# Patient Record
Sex: Female | Born: 1980 | Race: White | Hispanic: No | Marital: Single | Smoking: Former smoker
Health system: Southern US, Community
[De-identification: ages and names within clinical notes are randomized; demographics above are authoritative.]

## PROBLEM LIST (undated history)

## (undated) DIAGNOSIS — K760 Fatty (change of) liver, not elsewhere classified: Secondary | ICD-10-CM

## (undated) DIAGNOSIS — R7303 Prediabetes: Secondary | ICD-10-CM

## (undated) DIAGNOSIS — R87629 Unspecified abnormal cytological findings in specimens from vagina: Secondary | ICD-10-CM

## (undated) DIAGNOSIS — T85898A Other specified complication of other internal prosthetic devices, implants and grafts, initial encounter: Secondary | ICD-10-CM

## (undated) DIAGNOSIS — D649 Anemia, unspecified: Secondary | ICD-10-CM

## (undated) DIAGNOSIS — B9689 Other specified bacterial agents as the cause of diseases classified elsewhere: Secondary | ICD-10-CM

## (undated) DIAGNOSIS — B999 Unspecified infectious disease: Secondary | ICD-10-CM

## (undated) DIAGNOSIS — K289 Gastrojejunal ulcer, unspecified as acute or chronic, without hemorrhage or perforation: Secondary | ICD-10-CM

## (undated) DIAGNOSIS — N760 Acute vaginitis: Secondary | ICD-10-CM

## (undated) HISTORY — PX: DILATION AND CURETTAGE OF UTERUS: SHX78

## (undated) HISTORY — DX: Prediabetes: R73.03

## (undated) HISTORY — DX: Unspecified infectious disease: B99.9

## (undated) HISTORY — DX: Unspecified abnormal cytological findings in specimens from vagina: R87.629

## (undated) HISTORY — PX: WRIST SURGERY: SHX841

## (undated) HISTORY — PX: GASTRIC BYPASS: SHX52

## (undated) HISTORY — PX: BREAST SURGERY: SHX581

## (undated) HISTORY — PX: TONSILLECTOMY: SUR1361

---

## 2012-10-04 HISTORY — DX: Morbid (severe) obesity due to excess calories: E66.01

## 2013-10-04 HISTORY — PX: BREAST SURGERY: SHX581

## 2014-09-23 LAB — CYTOLOGY - PAP: CYTOLOGY - PAP: NEGATIVE

## 2015-01-20 LAB — OB RESULTS CONSOLE HIV ANTIBODY (ROUTINE TESTING): HIV: NONREACTIVE

## 2015-01-20 LAB — OB RESULTS CONSOLE PLATELET COUNT: Platelets: 259 10*3/uL

## 2015-01-20 LAB — TSH: TSH: 0.91

## 2015-01-20 LAB — OB RESULTS CONSOLE ANTIBODY SCREEN: Antibody Screen: NEGATIVE

## 2015-01-20 LAB — OB RESULTS CONSOLE VARICELLA ZOSTER ANTIBODY, IGG: VARICELLA IGG: IMMUNE

## 2015-01-20 LAB — OB RESULTS CONSOLE ABO/RH: RH TYPE: POSITIVE

## 2015-01-20 LAB — OB RESULTS CONSOLE GC/CHLAMYDIA
Chlamydia: NEGATIVE
Gonorrhea: NEGATIVE

## 2015-01-20 LAB — OB RESULTS CONSOLE HEPATITIS B SURFACE ANTIGEN: HEP B S AG: NEGATIVE

## 2015-01-20 LAB — OB RESULTS CONSOLE HGB/HCT, BLOOD
HCT: 37 %
Hemoglobin: 12.3 g/dL

## 2015-01-20 LAB — OB RESULTS CONSOLE RUBELLA ANTIBODY, IGM: RUBELLA: IMMUNE

## 2015-01-20 LAB — GLUCOSE TOLERANCE, 1 HOUR (50G) W/O FASTING: Glucose 1 Hr Prenatal, POC: 45 mg/dL

## 2015-03-11 LAB — CULTURE, OB URINE: Urine Culture, OB: NEGATIVE

## 2015-06-16 ENCOUNTER — Encounter: Payer: Self-pay | Admitting: *Deleted

## 2015-06-16 DIAGNOSIS — R87619 Unspecified abnormal cytological findings in specimens from cervix uteri: Secondary | ICD-10-CM | POA: Insufficient documentation

## 2015-06-16 DIAGNOSIS — A749 Chlamydial infection, unspecified: Secondary | ICD-10-CM | POA: Insufficient documentation

## 2015-06-16 DIAGNOSIS — E079 Disorder of thyroid, unspecified: Secondary | ICD-10-CM | POA: Insufficient documentation

## 2015-06-16 DIAGNOSIS — Z9884 Bariatric surgery status: Secondary | ICD-10-CM | POA: Insufficient documentation

## 2015-06-24 ENCOUNTER — Ambulatory Visit (INDEPENDENT_AMBULATORY_CARE_PROVIDER_SITE_OTHER): Payer: Self-pay | Admitting: Family Medicine

## 2015-06-24 ENCOUNTER — Encounter: Payer: Self-pay | Admitting: Family Medicine

## 2015-06-24 VITALS — BP 128/77 | HR 88 | Temp 98.6°F | Ht 66.0 in | Wt 243.8 lb

## 2015-06-24 DIAGNOSIS — R87619 Unspecified abnormal cytological findings in specimens from cervix uteri: Secondary | ICD-10-CM

## 2015-06-24 DIAGNOSIS — O98813 Other maternal infectious and parasitic diseases complicating pregnancy, third trimester: Secondary | ICD-10-CM

## 2015-06-24 DIAGNOSIS — O0993 Supervision of high risk pregnancy, unspecified, third trimester: Secondary | ICD-10-CM

## 2015-06-24 DIAGNOSIS — E079 Disorder of thyroid, unspecified: Secondary | ICD-10-CM

## 2015-06-24 DIAGNOSIS — A749 Chlamydial infection, unspecified: Secondary | ICD-10-CM

## 2015-06-24 DIAGNOSIS — Z98891 History of uterine scar from previous surgery: Secondary | ICD-10-CM | POA: Insufficient documentation

## 2015-06-24 DIAGNOSIS — Z23 Encounter for immunization: Secondary | ICD-10-CM

## 2015-06-24 DIAGNOSIS — O0990 Supervision of high risk pregnancy, unspecified, unspecified trimester: Secondary | ICD-10-CM | POA: Insufficient documentation

## 2015-06-24 DIAGNOSIS — O26849 Uterine size-date discrepancy, unspecified trimester: Secondary | ICD-10-CM | POA: Insufficient documentation

## 2015-06-24 DIAGNOSIS — O3421 Maternal care for scar from previous cesarean delivery: Secondary | ICD-10-CM

## 2015-06-24 DIAGNOSIS — Z9884 Bariatric surgery status: Secondary | ICD-10-CM

## 2015-06-24 DIAGNOSIS — O26843 Uterine size-date discrepancy, third trimester: Secondary | ICD-10-CM

## 2015-06-24 LAB — POCT URINALYSIS DIP (DEVICE)
BILIRUBIN URINE: NEGATIVE
Glucose, UA: NEGATIVE mg/dL
HGB URINE DIPSTICK: NEGATIVE
KETONES UR: NEGATIVE mg/dL
LEUKOCYTES UA: NEGATIVE
Nitrite: NEGATIVE
Protein, ur: NEGATIVE mg/dL
SPECIFIC GRAVITY, URINE: 1.025 (ref 1.005–1.030)
Urobilinogen, UA: 1 mg/dL (ref 0.0–1.0)
pH: 6.5 (ref 5.0–8.0)

## 2015-06-24 MED ORDER — TETANUS-DIPHTH-ACELL PERTUSSIS 5-2.5-18.5 LF-MCG/0.5 IM SUSP
0.5000 mL | Freq: Once | INTRAMUSCULAR | Status: AC
  Administered 2015-06-24: 0.5 mL via INTRAMUSCULAR

## 2015-06-24 NOTE — Assessment & Plan Note (Signed)
02/2014 ASCUS + HPV--> pp colpo

## 2015-06-24 NOTE — Patient Instructions (Signed)
Third Trimester of Pregnancy The third trimester is from week 29 through week 42, months 7 through 9. The third trimester is a time when the fetus is growing rapidly. At the end of the ninth month, the fetus is about 20 inches in length and weighs 6-10 pounds.  BODY CHANGES Your body goes through many changes during pregnancy. The changes vary from woman to woman.   Your weight will continue to increase. You can expect to gain 25-35 pounds (11-16 kg) by the end of the pregnancy.  You may begin to get stretch marks on your hips, abdomen, and breasts.  You may urinate more often because the fetus is moving lower into your pelvis and pressing on your bladder.  You may develop or continue to have heartburn as a result of your pregnancy.  You may develop constipation because certain hormones are causing the muscles that push waste through your intestines to slow down.  You may develop hemorrhoids or swollen, bulging veins (varicose veins).  You may have pelvic pain because of the weight gain and pregnancy hormones relaxing your joints between the bones in your pelvis. Backaches may result from overexertion of the muscles supporting your posture.  You may have changes in your hair. These can include thickening of your hair, rapid growth, and changes in texture. Some women also have hair loss during or after pregnancy, or hair that feels dry or thin. Your hair will most likely return to normal after your baby is born.  Your breasts will continue to grow and be tender. A yellow discharge may leak from your breasts called colostrum.  Your belly button may stick out.  You may feel short of breath because of your expanding uterus.  You may notice the fetus "dropping," or moving lower in your abdomen.  You may have a bloody mucus discharge. This usually occurs a few days to a week before labor begins.  Your cervix becomes thin and soft (effaced) near your due date. WHAT TO EXPECT AT YOUR PRENATAL  EXAMS  You will have prenatal exams every 2 weeks until week 36. Then, you will have weekly prenatal exams. During a routine prenatal visit:  You will be weighed to make sure you and the fetus are growing normally.  Your blood pressure is taken.  Your abdomen will be measured to track your baby's growth.  The fetal heartbeat will be listened to.  Any test results from the previous visit will be discussed.  You may have a cervical check near your due date to see if you have effaced. At around 36 weeks, your caregiver will check your cervix. At the same time, your caregiver will also perform a test on the secretions of the vaginal tissue. This test is to determine if a type of bacteria, Group B streptococcus, is present. Your caregiver will explain this further. Your caregiver may ask you:  What your birth plan is.  How you are feeling.  If you are feeling the baby move.  If you have had any abnormal symptoms, such as leaking fluid, bleeding, severe headaches, or abdominal cramping.  If you have any questions. Other tests or screenings that may be performed during your third trimester include:  Blood tests that check for low iron levels (anemia).  Fetal testing to check the health, activity level, and growth of the fetus. Testing is done if you have certain medical conditions or if there are problems during the pregnancy. FALSE LABOR You may feel small, irregular contractions that   eventually go away. These are called Braxton Hicks contractions, or false labor. Contractions may last for hours, days, or even weeks before true labor sets in. If contractions come at regular intervals, intensify, or become painful, it is best to be seen by your caregiver.  SIGNS OF LABOR   Menstrual-like cramps.  Contractions that are 5 minutes apart or less.  Contractions that start on the top of the uterus and spread down to the lower abdomen and back.  A sense of increased pelvic pressure or back  pain.  A watery or bloody mucus discharge that comes from the vagina. If you have any of these signs before the 37th week of pregnancy, call your caregiver right away. You need to go to the hospital to get checked immediately. HOME CARE INSTRUCTIONS   Avoid all smoking, herbs, alcohol, and unprescribed drugs. These chemicals affect the formation and growth of the baby.  Follow your caregiver's instructions regarding medicine use. There are medicines that are either safe or unsafe to take during pregnancy.  Exercise only as directed by your caregiver. Experiencing uterine cramps is a good sign to stop exercising.  Continue to eat regular, healthy meals.  Wear a good support bra for breast tenderness.  Do not use hot tubs, steam rooms, or saunas.  Wear your seat belt at all times when driving.  Avoid raw meat, uncooked cheese, cat litter boxes, and soil used by cats. These carry germs that can cause birth defects in the baby.  Take your prenatal vitamins.  Try taking a stool softener (if your caregiver approves) if you develop constipation. Eat more high-fiber foods, such as fresh vegetables or fruit and whole grains. Drink plenty of fluids to keep your urine clear or pale yellow.  Take warm sitz baths to soothe any pain or discomfort caused by hemorrhoids. Use hemorrhoid cream if your caregiver approves.  If you develop varicose veins, wear support hose. Elevate your feet for 15 minutes, 3-4 times a day. Limit salt in your diet.  Avoid heavy lifting, wear low heal shoes, and practice good posture.  Rest a lot with your legs elevated if you have leg cramps or low back pain.  Visit your dentist if you have not gone during your pregnancy. Use a soft toothbrush to brush your teeth and be gentle when you floss.  A sexual relationship may be continued unless your caregiver directs you otherwise.  Do not travel far distances unless it is absolutely necessary and only with the approval  of your caregiver.  Take prenatal classes to understand, practice, and ask questions about the labor and delivery.  Make a trial run to the hospital.  Pack your hospital bag.  Prepare the baby's nursery.  Continue to go to all your prenatal visits as directed by your caregiver. SEEK MEDICAL CARE IF:  You are unsure if you are in labor or if your water has broken.  You have dizziness.  You have mild pelvic cramps, pelvic pressure, or nagging pain in your abdominal area.  You have persistent nausea, vomiting, or diarrhea.  You have a bad smelling vaginal discharge.  You have pain with urination. SEEK IMMEDIATE MEDICAL CARE IF:   You have a fever.  You are leaking fluid from your vagina.  You have spotting or bleeding from your vagina.  You have severe abdominal cramping or pain.  You have rapid weight loss or gain.  You have shortness of breath with chest pain.  You notice sudden or extreme swelling   of your face, hands, ankles, feet, or legs.  You have not felt your baby move in over an hour.  You have severe headaches that do not go away with medicine.  You have vision changes. Document Released: 09/14/2001 Document Revised: 09/25/2013 Document Reviewed: 11/21/2012 [facility] Patient Information 2015 [place], [place]. This information is not intended to replace advice given to you by your health care provider. Make sure you discuss any questions you have with your health care provider.  Nutrition for the New Mother  A new mother needs good health and nutrition so she can have energy to take care of a new baby. Whether a mother breastfeeds or formula feeds the baby, it is important to have a well-balanced diet. Foods from all the food groups should be chosen to meet the new mother's energy needs and to give her the nutrients needed for repair and healing.  A HEALTHY EATING PLAN The My Pyramid plan for Moms outlines what you should eat to help you and your baby stay  healthy. The energy and amount of food you need depends on whether or not you are breastfeeding. If you are breastfeeding you will need more nutrients. If you choose not to breastfeed, your nutrition goal should be to return to a healthy weight. Limiting calories may be needed if you are not breastfeeding.  HOME CARE INSTRUCTIONS   For a personal plan based on your unique needs, see your Registered Dietitian or visit [email or web address]  Eat a variety of foods. The plan below will help guide you. The following chart has a suggested daily meal plan from the My Pyramid for Moms.  Eat a variety of fruits and vegetables.  Eat more dark green and orange vegetables and cooked dried beans.  Make half your grains whole grains. Choose whole instead of refined grains.  Choose low-fat or lean meats and poultry.  Choose low-fat or fat-free dairy products like milk, cheese, or yogurt. Fruits  Breastfeeding: 2 cups  Non-Breastfeeding: 2 cups  What Counts as a serving?  1 cup of fruit or juice.   cup dried fruit. Vegetables  Breastfeeding: 3 cups  Non-Breastfeeding: 2  cups  What Counts as a serving?  1 cup raw or cooked vegetables.  Juice or 2 cups raw leafy vegetables. Grains  Breastfeeding: 8 oz  Non-Breastfeeding: 6 oz  What Counts as a serving?  1 slice bread.  1 oz ready-to-eat cereal.   cup cooked pasta, rice, or cereal. Meat and Beans  Breastfeeding: 6  oz  Non-Breastfeeding: 5  oz  What Counts as a serving?  1 oz lean meat, poultry, or fish   cup cooked dry beans   oz nuts or 1 egg  1 tbs peanut butter Milk  Breastfeeding: 3 cups  Non-Breastfeeding: 3 cups  What Counts as a serving?  1 cup milk.  8 oz yogurt.  1  oz cheese.  2 oz processed cheese. TIPS FOR THE BREASTFEEDING MOM  Rapid weight loss is not suggested when you are breastfeeding. By simply breastfeeding, you will be able to lose the weight gained during your pregnancy.  Your caregiver can keep track of your weight and tell you if your weight loss is appropriate.  Be sure to drink fluids. You may notice that you are thirstier than usual. A suggestion is to drink a glass of water or other beverage whenever you breastfeed.  Avoid alcohol as it can be passed into your breast milk.  Limit caffeine drinks to no more than 2  to 3 cups per day.  You may need to keep taking your prenatal vitamin while you are breastfeeding. Talk with your caregiver about taking a vitamin or supplement. RETURING TO A HEALTHY WEIGHT  The My Pyramid Plan for Moms will help you return to a healthy weight. It will also provide the nutrients you need.  You may need to limit "empty" calories. These include:  High fat foods like fried foods, fatty meats, fast food, butter, and mayonnaise.  High sugar foods like sodas, jelly, candy, and sweets.  Be physically active. Include 30 minutes of exercise or more each day. Choose an activity you like such as walking, swimming, biking, or aerobics. Check with your caregiver before you start to exercise. Document Released: 12/28/2007 Document Revised: 12/13/2011 Document Reviewed: 12/28/2007 [facility] Patient Information 2015 [place], [place]. This information is not intended to replace advice given to you by your health care provider. Make sure you discuss any questions you have with your health care provider.

## 2015-06-24 NOTE — Progress Notes (Signed)
Subjective:  Patricia Wise is a 34 y.o. G2P0101 at [redacted]w[redacted]d being seen today for ongoing prenatal care.  Patient reports no complaints.  Contractions: Not present.  Vag. Bleeding: None. Movement: Present. Denies leaking of fluid.   Moved from [place] H/o gastric bypass Anatomy scan - wnl  The following portions of the patient's history were reviewed and updated as appropriate: allergies, current medications, past family history, past medical history, past social history, past surgical history and problem list.   Objective:   Filed Vitals:   06/24/15 0855 06/24/15 0903  BP: 128/77   Pulse: 88   Temp: 98.6 F (37 C)   Height:  5\' 6"  (1.676 m)  Weight: 243 lb 12.8 oz (110.587 kg)     Fetal Status: Fetal Heart Rate (bpm): 158   Movement: Present     General:  Alert, oriented and cooperative. Patient is in no acute distress.  Skin: Skin is warm and dry. No rash noted.   Cardiovascular: Normal heart rate noted  Respiratory: Normal respiratory effort, no problems with respiration noted  Abdomen: Soft, gravid, appropriate for gestational age. Pain/Pressure: Present     Pelvic: Vag. Bleeding: None Vag D/C Character: White   Cervical exam deferred        Extremities: Normal range of motion.  Edema: Trace  Mental Status: Normal mood and affect. Normal behavior. Normal judgment and thought content.   Urinalysis: Urine Protein: Negative Urine Glucose: Negative  Assessment and Plan:  Pregnancy: G2P0101 at [redacted]w[redacted]d  1. Thyroid disease - in records, patient reports this is not true - Will get TSH  2. Supervision of high risk pregnancy, antepartum, third trimester -added pregnancy box and updated -return for 28 wk labs and glucola.   3. H/O gastric bypass -in 2014 -will check vitamin B12, D, CBC with 28 wk labs   4. Chlamydia -distant history, negative this pregnancy  5. 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 Size dates discrepancy- Measuring larger. Has history of oligohydramnios in prior pregnancy -Growth [place], no need for anatomy as she has already had this  Preterm labor symptoms and general obstetric precautions including but not limited to vaginal bleeding, contractions, leaking of fluid and fetal movement were reviewed in detail with the patient. Please refer to After Visit Summary for other counseling recommendations.   Return in about 2 weeks (around 07/08/2015) for Routine prenatal care.   Caren Macadam, MD

## 2015-06-24 NOTE — Progress Notes (Signed)
Initial and 28 week prenatal info packets given Breastfeeding tip of the week reviewed Patient unable to take 1hr gtt today, unable to drink glucola due to hx gastric bypass. Will return for 1hr gtt (jellybeans) and 28 week labs TDap today, declines flu

## 2015-06-25 ENCOUNTER — Other Ambulatory Visit: Payer: Self-pay

## 2015-06-25 ENCOUNTER — Encounter: Payer: Self-pay | Admitting: *Deleted

## 2015-06-25 DIAGNOSIS — E079 Disorder of thyroid, unspecified: Secondary | ICD-10-CM

## 2015-06-25 DIAGNOSIS — Z9884 Bariatric surgery status: Secondary | ICD-10-CM

## 2015-06-25 DIAGNOSIS — O0993 Supervision of high risk pregnancy, unspecified, third trimester: Secondary | ICD-10-CM

## 2015-06-25 LAB — CBC
HCT: 34.4 % — ABNORMAL LOW (ref 36.0–46.0)
Hemoglobin: 11.5 g/dL — ABNORMAL LOW (ref 12.0–15.0)
MCH: 30 pg (ref 26.0–34.0)
MCHC: 33.4 g/dL (ref 30.0–36.0)
MCV: 89.8 fL (ref 78.0–100.0)
MPV: 9.7 fL (ref 8.6–12.4)
PLATELETS: 251 10*3/uL (ref 150–400)
RBC: 3.83 MIL/uL — ABNORMAL LOW (ref 3.87–5.11)
RDW: 13.7 % (ref 11.5–15.5)
WBC: 10.8 10*3/uL — ABNORMAL HIGH (ref 4.0–10.5)

## 2015-06-26 LAB — VITAMIN D 25 HYDROXY (VIT D DEFICIENCY, FRACTURES): Vit D, 25-Hydroxy: 29 ng/mL — ABNORMAL LOW (ref 30–100)

## 2015-06-26 LAB — TSH: TSH: 1.815 u[IU]/mL (ref 0.350–4.500)

## 2015-06-26 LAB — GLUCOSE TOLERANCE, 1 HOUR (50G) W/O FASTING: GLUCOSE 1 HOUR GTT: 64 mg/dL — AB (ref 70–140)

## 2015-06-26 LAB — VITAMIN B12: Vitamin B-12: 476 pg/mL (ref 211–911)

## 2015-06-26 LAB — HIV ANTIBODY (ROUTINE TESTING W REFLEX): HIV 1&2 Ab, 4th Generation: NONREACTIVE

## 2015-06-27 LAB — RPR

## 2015-07-02 ENCOUNTER — Other Ambulatory Visit: Payer: Self-pay | Admitting: Family Medicine

## 2015-07-02 DIAGNOSIS — O26843 Uterine size-date discrepancy, third trimester: Secondary | ICD-10-CM

## 2015-07-02 DIAGNOSIS — O09299 Supervision of pregnancy with other poor reproductive or obstetric history, unspecified trimester: Secondary | ICD-10-CM

## 2015-07-02 DIAGNOSIS — Z3A32 32 weeks gestation of pregnancy: Secondary | ICD-10-CM

## 2015-07-02 DIAGNOSIS — Z3689 Encounter for other specified antenatal screening: Secondary | ICD-10-CM

## 2015-07-03 ENCOUNTER — Ambulatory Visit (HOSPITAL_COMMUNITY)
Admission: RE | Admit: 2015-07-03 | Discharge: 2015-07-03 | Disposition: A | Discharge status: Home / Self Care | Payer: Self-pay | Source: Ambulatory Visit | Attending: Family Medicine | Admitting: Family Medicine

## 2015-07-03 ENCOUNTER — Other Ambulatory Visit: Payer: Self-pay | Admitting: Family Medicine

## 2015-07-03 DIAGNOSIS — O26843 Uterine size-date discrepancy, third trimester: Secondary | ICD-10-CM | POA: Insufficient documentation

## 2015-07-03 DIAGNOSIS — O2690 Pregnancy related conditions, unspecified, unspecified trimester: Secondary | ICD-10-CM

## 2015-07-03 DIAGNOSIS — O3421 Maternal care for scar from previous cesarean delivery: Secondary | ICD-10-CM | POA: Insufficient documentation

## 2015-07-03 DIAGNOSIS — Z3689 Encounter for other specified antenatal screening: Secondary | ICD-10-CM

## 2015-07-03 DIAGNOSIS — Z3A32 32 weeks gestation of pregnancy: Secondary | ICD-10-CM | POA: Insufficient documentation

## 2015-07-03 DIAGNOSIS — O34219 Maternal care for unspecified type scar from previous cesarean delivery: Secondary | ICD-10-CM

## 2015-07-03 DIAGNOSIS — Z36 Encounter for antenatal screening of mother: Secondary | ICD-10-CM | POA: Insufficient documentation

## 2015-07-03 DIAGNOSIS — O09219 Supervision of pregnancy with history of pre-term labor, unspecified trimester: Secondary | ICD-10-CM

## 2015-07-03 DIAGNOSIS — O09899 Supervision of other high risk pregnancies, unspecified trimester: Secondary | ICD-10-CM

## 2015-07-03 DIAGNOSIS — O09299 Supervision of pregnancy with other poor reproductive or obstetric history, unspecified trimester: Secondary | ICD-10-CM

## 2015-07-05 DIAGNOSIS — B9689 Other specified bacterial agents as the cause of diseases classified elsewhere: Secondary | ICD-10-CM

## 2015-07-05 HISTORY — DX: Other specified bacterial agents as the cause of diseases classified elsewhere: B96.89

## 2015-07-08 ENCOUNTER — Ambulatory Visit (INDEPENDENT_AMBULATORY_CARE_PROVIDER_SITE_OTHER): Payer: Medicaid Other | Admitting: Certified Nurse Midwife

## 2015-07-08 VITALS — BP 107/62 | HR 74 | Temp 98.4°F | Wt 146.0 lb

## 2015-07-08 DIAGNOSIS — Z3483 Encounter for supervision of other normal pregnancy, third trimester: Secondary | ICD-10-CM | POA: Diagnosis not present

## 2015-07-08 LAB — POCT URINALYSIS DIP (DEVICE)
Bilirubin Urine: NEGATIVE
Glucose, UA: NEGATIVE mg/dL
Hgb urine dipstick: NEGATIVE
Nitrite: NEGATIVE
PH: 6.5 (ref 5.0–8.0)
PROTEIN: NEGATIVE mg/dL
Urobilinogen, UA: 1 mg/dL (ref 0.0–1.0)

## 2015-07-08 NOTE — Progress Notes (Signed)
Pt is not going to breastfeed, declines teaching Declined Flu vaccine

## 2015-07-08 NOTE — Progress Notes (Signed)
  Subjective:  Patricia Wise is a 34 y.o. G2P0101 at [redacted]w[redacted]d being seen today for ongoing prenatal care.  Patient reports no complaints.  Contractions: Not present.  Vag. Bleeding: None. Movement: Present. Denies leaking of fluid.   The following portions of the patient's history were reviewed and updated as appropriate: allergies, current medications, past family history, past medical history, past social history, past surgical history and problem list.   Objective:   Filed Vitals:   07/08/15 0932  BP: 107/62  Pulse: 74  Temp: 98.4 F (36.9 C)  Weight: 146 lb (66.225 kg)    Fetal Status: Fetal Heart Rate (bpm): 139   Movement: Present     General:  Alert, oriented and cooperative. Patient is in no acute distress.  Skin: Skin is warm and dry. No rash noted.   Cardiovascular: Normal heart rate noted  Respiratory: Normal respiratory effort, no problems with respiration noted  Abdomen: Soft, gravid, appropriate for gestational age. Pain/Pressure: Present     Pelvic: Vag. Bleeding: None     Cervical exam deferred        Extremities: Normal range of motion.  Edema: Trace  Mental Status: Normal mood and affect. Normal behavior. Normal judgment and thought content.   Urinalysis:      Assessment and Plan:  Pregnancy: G2P0101 at [redacted]w[redacted]d  There are no diagnoses linked to this encounter. Term labor symptoms and general obstetric precautions including but not limited to vaginal bleeding, contractions, leaking of fluid and fetal movement were reviewed in detail with the patient. Please refer to After Visit Summary for other counseling recommendations.  Return in about 2 weeks (around 07/22/2015).  Discussed u/s from 9/29 Schedule next appointment with MD to schedule [facility] Larey Days, CNM

## 2015-07-08 NOTE — Patient Instructions (Signed)
Cesarean Delivery  Cesarean delivery is the birth of a baby through a cut (incision) in the abdomen and womb (uterus).  LET YOUR HEALTH CARE PROVIDER KNOW ABOUT:  All medicines you are taking, including vitamins, herbs, eye drops, creams, and over-the-counter medicines.  Previous problems you or members of your family have had with the use of anesthetics.  Any blood disorders you have.  Previous surgeries you have had.  Medical conditions you have.  Any allergies you have.  Complicationsinvolving the pregnancy. RISKS AND COMPLICATIONS  Generally, this is a safe procedure. However, as with any procedure, complications can occur. Possible complications include:  Bleeding.  Infection.  Blood clots.  Injury to surrounding organs.  Problems with anesthesia.  Injury to the baby. BEFORE THE PROCEDURE   You may be given an antacid medicine to drink. This will prevent acid contents in your stomach from going into your lungs if you vomit during the surgery.  You may be given an antibiotic medicine to prevent infection. PROCEDURE   Hair may be removed from your pubic area and your lower abdomen. This is to prevent infection in the incision site.  A tube (Foley catheter) will be placed in your bladder to drain your urine from your bladder into a bag. This keeps your bladder empty during surgery.  An IV tube will be placed in your vein.  You may be given medicine to numb the lower half of your body (regional anesthetic). If you were in labor, you may have already had an epidural in place which can be used in both labor and cesarean delivery. You may possibly be given medicine to make you sleep (general anesthetic) though this is not as common.  An incision will be made in your abdomen that extends to your uterus. There are 2 basic kinds of incisions:  The horizontal (transverse) incision. Horizontal incisions are from side to side and are used for most routine cesarean  deliveries.  The vertical incision. The vertical incision is from the top of the abdomen to the bottom and is less commonly used. It is often done for women who have a serious complication (extreme prematurity) or under emergency situations.  The horizontal and vertical incisions may both be used at the same time. However, this is very uncommon.  An incision is then made in your uterus to deliver the baby.  Your baby will then be delivered.  Both incisions are then closed with absorbable stitches. AFTER THE PROCEDURE   If you were awake during the surgery, you will see your baby right away. If you were asleep, you will see your baby as soon as you are awake.  You may breastfeed your baby after surgery.  You may be able to get up and walk the same day as the surgery. If you need to stay in bed for a period of time, you will receive help to turn, cough, and take deep breaths after surgery. This helps prevent lung problems such as pneumonia.  Do not get out of bed alone the first time after surgery. You will need help getting out of bed until you are able to do this by yourself.  You may be able to shower the day after your cesarean delivery. After the bandage (dressing) is taken off the incision site, a nurse will assist you to shower if you would like help.  You will have pneumatic compression hose placed on your lower legs. This is done to prevent blood clots. When you are up   and walking regularly, they will no longer be necessary.  Do not cross your legs when you sit.  Save any blood clots that you pass. If you pass a clot while on the toilet, do not flush it. Call for the nurse. Tell the nurse if you think you are bleeding too much or passing too many clots.  You will be given medicine as needed. Let your health care providers know if you are hurting. You may also be given an antibiotic to prevent an infection.  Your IV tube will be taken out when you are drinking a reasonable  amount of fluids. The Foley catheter is taken out when you are up and walking.  If your blood type is Rh negative and your baby's blood type is Rh positive, you will be given a shot of anti-D immune globulin. This shot prevents you from having Rh problems with a future pregnancy. You should get the shot even if you had your tubes tied (tubal ligation).  If you are allowed to take the baby for a walk, place the baby in the bassinet and push it. Do not carry your baby in your arms. Document Released: 09/20/2005 Document Revised: 07/11/2013 Document Reviewed: 04/11/2013 [employer] Patient Information 2015 [employer]. This information is not intended to replace advice given to you by your health care provider. Make sure you discuss any questions you have with your health care provider.  

## 2015-07-16 ENCOUNTER — Encounter (HOSPITAL_COMMUNITY): Payer: Self-pay

## 2015-07-16 ENCOUNTER — Inpatient Hospital Stay (HOSPITAL_COMMUNITY)
Admission: AD | Admit: 2015-07-16 | Discharge: 2015-07-16 | Disposition: A | Discharge status: Home / Self Care | Payer: Medicaid Other | Source: Ambulatory Visit | Attending: Obstetrics and Gynecology | Admitting: Obstetrics and Gynecology

## 2015-07-16 DIAGNOSIS — O368190 Decreased fetal movements, unspecified trimester, not applicable or unspecified: Secondary | ICD-10-CM | POA: Diagnosis not present

## 2015-07-16 DIAGNOSIS — Z3A34 34 weeks gestation of pregnancy: Secondary | ICD-10-CM | POA: Insufficient documentation

## 2015-07-16 DIAGNOSIS — O368130 Decreased fetal movements, third trimester, not applicable or unspecified: Secondary | ICD-10-CM | POA: Insufficient documentation

## 2015-07-16 DIAGNOSIS — Z87891 Personal history of nicotine dependence: Secondary | ICD-10-CM | POA: Diagnosis not present

## 2015-07-16 DIAGNOSIS — R1010 Upper abdominal pain, unspecified: Secondary | ICD-10-CM | POA: Insufficient documentation

## 2015-07-16 LAB — URINALYSIS, ROUTINE W REFLEX MICROSCOPIC
Bilirubin Urine: NEGATIVE
Glucose, UA: NEGATIVE mg/dL
Hgb urine dipstick: NEGATIVE
KETONES UR: NEGATIVE mg/dL
LEUKOCYTES UA: NEGATIVE
NITRITE: NEGATIVE
PH: 6.5 (ref 5.0–8.0)
PROTEIN: NEGATIVE mg/dL
Specific Gravity, Urine: 1.02 (ref 1.005–1.030)
Urobilinogen, UA: 4 mg/dL — ABNORMAL HIGH (ref 0.0–1.0)

## 2015-07-16 NOTE — MAU Provider Note (Signed)
History    CSN: [id number]  Arrival date and time: 07/16/15 1045   None     Chief Complaint  Patient presents with  . Abdominal Pain   HPI   Pregnant patient presents for abdominal pain and decreased fetal movements.  Decreased FM was noted this morning upon waking. Did not feel movements from 0630 until patient presented to the MAU (@1050 ) where she felt movements for the first time today. Patient was concerned as the baby did not respond to its normal stimuli (food, movement). Patient was especially concerned as she had to be induced in previous pregnancy due to oligohydramnios (diagnosed when patient noticed decreased fetal movements in previous pregnancy), and the child was ultimately delivered via C/S due to FTP.  Patient also endorses abdominal pain in her upper left abdomen which she describes as a "bad cramp". She describes it as constant pain which is non-positional and does not improve with tylenol. She states that she can decreased the pain somewhat by pressing firmly upon the area; however the pain returns as soon as she lets go. Patient also endorses pressure in "crotch," which she has experienced over the past week. She did not feel this pressure in previous pregnancy. However, she also states that she never felt contractions in her prior pregnancy and is thus worried that the pain and pressure could be contractions. Patient also endorses diarrhea since this morning. She has experienced daily nausea and vomiting throughout this pregnancy. She does not drink many fluids on a regular basis.  OB History    Gravida Para Term Preterm AB TAB SAB Ectopic Multiple Living   2 1  1      1       Past Medical History  Diagnosis Date  . Vaginal Pap smear, abnormal   . mrsa   . Preterm labor     07/2007    Past Surgical History  Procedure Laterality Date  . Cesarean section    . Gastric bypass    . Breast surgery      augmentation  . Tonsillectomy    . Wrist surgery Bilateral      carpal tunnel    Family History  Problem Relation Age of Onset  . Cancer Mother     Social History  Substance Use Topics  . Smoking status: Former Smoker    Quit date: 08/04/2012  . Smokeless tobacco: Never Used  . Alcohol Use: No    Allergies: No Known Allergies  Prescriptions prior to admission  Medication Sig Dispense Refill Last Dose  . acetaminophen (TYLENOL) 500 MG tablet Take 1,500 mg by mouth 2 (two) times daily as needed for mild pain or headache.   07/15/2015 at Unknown time  . Prenatal Vit-Fe Fumarate-FA (PRENATAL MULTIVITAMIN) TABS tablet Take 1 tablet by mouth daily at 12 noon. Flintstones chewables   07/16/2015 at Unknown time  . simethicone (MYLICON) 80 MG chewable tablet Chew 160 mg by mouth every 6 (six) hours as needed for flatulence.   Past Week at Unknown time    Review of Systems  Constitutional: Negative for fever and chills.  Respiratory: Negative for shortness of breath.   Cardiovascular: Negative for leg swelling.  Gastrointestinal: Positive for nausea, vomiting and diarrhea.  Neurological: Positive for headaches.   Physical Exam   Blood pressure 120/65, pulse 75, temperature 98.5 F (36.9 C), temperature source Oral, resp. rate 17, height 5\' 6"  (1.676 m), weight 111.131 kg (245 lb), last menstrual period 11/18/2014.  Physical Exam  Constitutional:  Vital signs are normal. She appears well-developed and well-nourished. She is cooperative. She is easily aroused.  Cardiovascular: Normal rate and regular rhythm.   Respiratory: Effort normal and breath sounds normal.  GI: Soft. She exhibits no mass (other than pregnancy). There is tenderness (in the LUQ and RUQ. LUQ pain greater than RUQ pain. LUQ pain improves with pressure, while RUQ worsens with pressure.). There is rebound (in the LUQ). There is no rigidity and no guarding.  gravid  Neurological: She is alert and easily aroused.  Skin: Skin is warm and dry. No rash noted.  Psychiatric: She has  a normal mood and affect. Her behavior is normal. Judgment and thought content normal.  Patient states that she is somewhat anxious about her current symptoms.    MAU Course  Procedures   UA negative for nitrites, leukocytes, ketones and hgb.   Bedside U/S showed AFI 8.127.  AFI on ultrasound 07/03/2015 was 11.08 cm.   Uterine irritability on toco FHR with baseline of 135, good variability and accelerations.   Assessment and Plan  Patient presented with new onset upper abdominal pain and decreased fetal movements. Fetus had a responsive strip while monitored at the MAU, and movement was felt twice by mother while admitted. AFI was > 5, ruling out oligohydramnios. Abdominal tenderness was localized laterally and consistent with muscle cramping. Maternal vitals were normal with no fever or tachycardia, making chorioamnionitis unlikely. In addition, UA was negative for nitrites and leukocytes, making UTI or pyelonephritis unlikely. Mother and fetus in stable condition.   -Discharged home -Fetal kick counts reinforced -Pre-term labor precautions reviewed -Recommended tylenol for pain  Future Appointments Date Time Provider [place]  07/23/2015 9:20 AM Caren Macadam, MD [facility] [place] with [place] MS3 07/16/2015, 11:28 AM   OB FELLOW MAU DISCHARGE ATTESTATION  I have seen and examined this patient; I agree with above documentation in the resident's note. Subjective decreased fetal movement, but present movement in our triage today, reactive NST, and normal AFI (thus normal modified bpp). Cervix closed, no signs/symptoms rom. Home with kick counts and ob f/u next week.   Desma Maxim, MD 4:17 PM

## 2015-07-16 NOTE — Discharge Instructions (Signed)
Fetal Movement Counts  Patient Name: __________________________________________________ Patient Due Date: ____________________  Performing a fetal movement count is highly recommended in high-risk pregnancies, but it is good for every pregnant woman to do. Your health care provider may ask you to start counting fetal movements at 28 weeks of the pregnancy. Fetal movements often increase:  · After eating a full meal.  · After physical activity.  · After eating or drinking something sweet or cold.  · At rest.  Pay attention to when you feel the baby is most active. This will help you notice a pattern of your baby's sleep and wake cycles and what factors contribute to an increase in fetal movement. It is important to perform a fetal movement count at the same time each day when your baby is normally most active.   HOW TO COUNT FETAL MOVEMENTS  1. Find a quiet and comfortable area to sit or lie down on your left side. Lying on your left side provides the best blood and oxygen circulation to your baby.  2. Write down the day and time on a sheet of paper or in a journal.  3. Start counting kicks, flutters, swishes, rolls, or jabs in a 2-hour period. You should feel at least 10 movements within 2 hours.  4. If you do not feel 10 movements in 2 hours, wait 2-3 hours and count again. Look for a change in the pattern or not enough counts in 2 hours.  SEEK MEDICAL CARE IF:  · You feel less than 10 counts in 2 hours, tried twice.  · There is no movement in over an hour.  · The pattern is changing or taking longer each day to reach 10 counts in 2 hours.  · You feel the baby is not moving as he or she usually does.  Date: ____________ Movements: ____________ Start time: ____________ Finish time: ____________   Date: ____________ Movements: ____________ Start time: ____________ Finish time: ____________  Date: ____________ Movements: ____________ Start time: ____________ Finish time: ____________  Date: ____________ Movements:  ____________ Start time: ____________ Finish time: ____________  Date: ____________ Movements: ____________ Start time: ____________ Finish time: ____________  Date: ____________ Movements: ____________ Start time: ____________ Finish time: ____________  Date: ____________ Movements: ____________ Start time: ____________ Finish time: ____________  Date: ____________ Movements: ____________ Start time: ____________ Finish time: ____________   Date: ____________ Movements: ____________ Start time: ____________ Finish time: ____________  Date: ____________ Movements: ____________ Start time: ____________ Finish time: ____________  Date: ____________ Movements: ____________ Start time: ____________ Finish time: ____________  Date: ____________ Movements: ____________ Start time: ____________ Finish time: ____________  Date: ____________ Movements: ____________ Start time: ____________ Finish time: ____________  Date: ____________ Movements: ____________ Start time: ____________ Finish time: ____________  Date: ____________ Movements: ____________ Start time: ____________ Finish time: ____________   Date: ____________ Movements: ____________ Start time: ____________ Finish time: ____________  Date: ____________ Movements: ____________ Start time: ____________ Finish time: ____________  Date: ____________ Movements: ____________ Start time: ____________ Finish time: ____________  Date: ____________ Movements: ____________ Start time: ____________ Finish time: ____________  Date: ____________ Movements: ____________ Start time: ____________ Finish time: ____________  Date: ____________ Movements: ____________ Start time: ____________ Finish time: ____________  Date: ____________ Movements: ____________ Start time: ____________ Finish time: ____________   Date: ____________ Movements: ____________ Start time: ____________ Finish time: ____________  Date: ____________ Movements: ____________ Start time: ____________ Finish  time: ____________  Date: ____________ Movements: ____________ Start time: ____________ Finish time: ____________  Date: ____________ Movements: ____________ Start time:   ____________ Finish time: ____________  Date: ____________ Movements: ____________ Start time: ____________ Finish time: ____________  Date: ____________ Movements: ____________ Start time: ____________ Finish time: ____________  Date: ____________ Movements: ____________ Start time: ____________ Finish time: ____________   Date: ____________ Movements: ____________ Start time: ____________ Finish time: ____________  Date: ____________ Movements: ____________ Start time: ____________ Finish time: ____________  Date: ____________ Movements: ____________ Start time: ____________ Finish time: ____________  Date: ____________ Movements: ____________ Start time: ____________ Finish time: ____________  Date: ____________ Movements: ____________ Start time: ____________ Finish time: ____________  Date: ____________ Movements: ____________ Start time: ____________ Finish time: ____________  Date: ____________ Movements: ____________ Start time: ____________ Finish time: ____________   Date: ____________ Movements: ____________ Start time: ____________ Finish time: ____________  Date: ____________ Movements: ____________ Start time: ____________ Finish time: ____________  Date: ____________ Movements: ____________ Start time: ____________ Finish time: ____________  Date: ____________ Movements: ____________ Start time: ____________ Finish time: ____________  Date: ____________ Movements: ____________ Start time: ____________ Finish time: ____________  Date: ____________ Movements: ____________ Start time: ____________ Finish time: ____________  Date: ____________ Movements: ____________ Start time: ____________ Finish time: ____________   Date: ____________ Movements: ____________ Start time: ____________ Finish time: ____________  Date: ____________  Movements: ____________ Start time: ____________ Finish time: ____________  Date: ____________ Movements: ____________ Start time: ____________ Finish time: ____________  Date: ____________ Movements: ____________ Start time: ____________ Finish time: ____________  Date: ____________ Movements: ____________ Start time: ____________ Finish time: ____________  Date: ____________ Movements: ____________ Start time: ____________ Finish time: ____________  Date: ____________ Movements: ____________ Start time: ____________ Finish time: ____________   Date: ____________ Movements: ____________ Start time: ____________ Finish time: ____________  Date: ____________ Movements: ____________ Start time: ____________ Finish time: ____________  Date: ____________ Movements: ____________ Start time: ____________ Finish time: ____________  Date: ____________ Movements: ____________ Start time: ____________ Finish time: ____________  Date: ____________ Movements: ____________ Start time: ____________ Finish time: ____________  Date: ____________ Movements: ____________ Start time: ____________ Finish time: ____________     This information is not intended to replace advice given to you by your health care provider. Make sure you discuss any questions you have with your health care provider.     Document Released: 10/20/2006 Document Revised: 10/11/2014 Document Reviewed: 07/17/2012  Elsevier Interactive Patient Education ©2016 [employer]

## 2015-07-16 NOTE — MAU Note (Signed)
Patient presents with upper abdominal pain starting yesterday around 1600 and decreased fetal movement

## 2015-07-16 NOTE — MAU Note (Signed)
Provider notified about patient G2P1 presents with abdominal pain and fatal movement, elevated fetal heart rate. Provider will come and see patient.

## 2015-07-23 ENCOUNTER — Encounter (HOSPITAL_COMMUNITY): Payer: Self-pay | Admitting: *Deleted

## 2015-07-23 ENCOUNTER — Encounter: Payer: Self-pay | Admitting: Family Medicine

## 2015-07-23 ENCOUNTER — Ambulatory Visit (INDEPENDENT_AMBULATORY_CARE_PROVIDER_SITE_OTHER): Payer: Medicaid Other | Admitting: Family Medicine

## 2015-07-23 VITALS — BP 108/77 | HR 77 | Temp 98.7°F | Wt 256.0 lb

## 2015-07-23 DIAGNOSIS — Z98891 History of uterine scar from previous surgery: Secondary | ICD-10-CM | POA: Diagnosis not present

## 2015-07-23 DIAGNOSIS — O99843 Bariatric surgery status complicating pregnancy, third trimester: Secondary | ICD-10-CM

## 2015-07-23 DIAGNOSIS — O0993 Supervision of high risk pregnancy, unspecified, third trimester: Secondary | ICD-10-CM

## 2015-07-23 DIAGNOSIS — Z9884 Bariatric surgery status: Secondary | ICD-10-CM

## 2015-07-23 DIAGNOSIS — R87619 Unspecified abnormal cytological findings in specimens from cervix uteri: Secondary | ICD-10-CM

## 2015-07-23 LAB — POCT URINALYSIS DIP (DEVICE)
Glucose, UA: NEGATIVE mg/dL
HGB URINE DIPSTICK: NEGATIVE
Ketones, ur: NEGATIVE mg/dL
LEUKOCYTES UA: NEGATIVE
NITRITE: NEGATIVE
PH: 5.5 (ref 5.0–8.0)
Protein, ur: NEGATIVE mg/dL
UROBILINOGEN UA: 0.2 mg/dL (ref 0.0–1.0)

## 2015-07-23 NOTE — Progress Notes (Signed)
Subjective:  Patricia Wise is a 34 y.o. G2P0101 at [redacted]w[redacted]d being seen today for ongoing prenatal care.  Patient reports no complaints.  Contractions: Not present.  Vag. Bleeding: None. Movement: Present. Denies leaking of fluid.   The following portions of the patient's history were reviewed and updated as appropriate: allergies, current medications, past family history, past medical history, past social history, past surgical history and problem list. Problem list updated.  Objective:   Filed Vitals:   07/23/15 1039  BP: 108/77  Pulse: 77  Temp: 98.7 F (37.1 C)  Weight: 256 lb (116.121 kg)    Fetal Status: Fetal Heart Rate (bpm): 150   Movement: Present     General:  Alert, oriented and cooperative. Patient is in no acute distress.  Skin: Skin is warm and dry. No rash noted.   Cardiovascular: Normal heart rate noted  Respiratory: Normal respiratory effort, no problems with respiration noted  Abdomen: Soft, gravid, appropriate for gestational age. Pain/Pressure: Present     Pelvic: Vag. Bleeding: None     Cervical exam deferred        Extremities: Normal range of motion.  Edema: Mild pitting, slight indentation  Mental Status: Normal mood and affect. Normal behavior. Normal judgment and thought content.   Urinalysis: Urine Protein: Negative Urine Glucose: Negative  Assessment and Plan:  Pregnancy: G2P0101 at [redacted]w[redacted]d  1. History of C-section -Desires repeat CS, schedule CS 39 weeks  2. Supervision of high risk pregnancy, antepartum, third trimester -updated pregnancy box -GBS collected  3. H/O gastric bypass -Vitamin D is low other screening labs wnl  - Start Vitamin D Supplementation.   4. Abnormal cervical Papanicolaou smear, unspecified abnormal pap finding -PP colpo  Preterm labor symptoms and general obstetric precautions including but not limited to vaginal bleeding, contractions, leaking of fluid and fetal movement were reviewed in detail with the  patient. Please refer to After Visit Summary for other counseling recommendations.  Return in about 1 week (around 07/30/2015) for Routine prenatal care.   Caren Macadam, MD

## 2015-07-23 NOTE — Progress Notes (Signed)
Breastfeeding tip of the week reviewed Urine: trace bilirubin

## 2015-07-23 NOTE — Patient Instructions (Signed)
Preterm Labor Information Preterm labor is when labor starts at less than 37 weeks of pregnancy. The normal length of a pregnancy is 39 to 41 weeks. CAUSES Often, there is no identifiable underlying cause as to why a woman goes into preterm labor. One of the most common known causes of preterm labor is infection. Infections of the uterus, cervix, vagina, amniotic sac, bladder, kidney, or even the lungs (pneumonia) can cause labor to start. Other suspected causes of preterm labor include:   Urogenital infections, such as yeast infections and bacterial vaginosis.   Uterine abnormalities (uterine shape, uterine septum, fibroids, or bleeding from the placenta).   A cervix that has been operated on (it may fail to stay closed).   Malformations in the fetus.   Multiple gestations (twins, triplets, and so on).   Breakage of the amniotic sac.  RISK FACTORS  Having a previous history of preterm labor.   Having premature rupture of membranes (PROM).   Having a placenta that covers the opening of the cervix (placenta previa).   Having a placenta that separates from the uterus (placental abruption).   Having a cervix that is too weak to hold the fetus in the uterus (incompetent cervix).   Having too much fluid in the amniotic sac (polyhydramnios).   Taking illegal drugs or smoking while pregnant.   Not gaining enough weight while pregnant.   Being younger than 3 and older than 33 years old.   Having a low socioeconomic status.   Being African American. SYMPTOMS Signs and symptoms of preterm labor include:   Menstrual-like cramps, abdominal pain, or back pain.  Uterine contractions that are regular, as frequent as six in an hour, regardless of their intensity (may be mild or painful).  Contractions that start on the top of the uterus and spread down to the lower abdomen and back.   A sense of increased pelvic pressure.   A watery or bloody mucus discharge that  comes from the vagina.  TREATMENT Depending on the length of the pregnancy and other circumstances, your health care provider may suggest bed rest. If necessary, there are medicines that can be given to stop contractions and to mature the fetal lungs. If labor happens before 34 weeks of pregnancy, a prolonged hospital stay may be recommended. Treatment depends on the condition of both you and the fetus.  WHAT SHOULD YOU DO IF YOU THINK YOU ARE IN PRETERM LABOR? Call your health care provider right away. You will need to go to the hospital to get checked immediately. HOW CAN YOU PREVENT PRETERM LABOR IN FUTURE PREGNANCIES? You should:   Stop smoking if you smoke.  Maintain healthy weight gain and avoid chemicals and drugs that are not necessary.  Be watchful for any type of infection.  Inform your health care provider if you have a known history of preterm labor.   This information is not intended to replace advice given to you by your health care provider. Make sure you discuss any questions you have with your health care provider.   Document Released: 12/11/2003 Document Revised: 05/23/2013 Document Reviewed: 10/23/2012 Elsevier Interactive Patient Education 2016 [place] of Pregnancy The third trimester is from week 29 through week 42, months 7 through 9. The third trimester is a time when the fetus is growing rapidly. At the end of the ninth month, the fetus is about 20 inches in length and weighs 6-10 pounds.  BODY CHANGES Your body goes through many changes during pregnancy.  The changes vary from woman to woman.   Your weight will continue to increase. You can expect to gain 25-35 pounds (11-16 kg) by the end of the pregnancy.  You may begin to get stretch marks on your hips, abdomen, and breasts.  You may urinate more often because the fetus is moving lower into your pelvis and pressing on your bladder.  You may develop or continue to have heartburn as a  result of your pregnancy.  You may develop constipation because certain hormones are causing the muscles that push waste through your intestines to slow down.  You may develop hemorrhoids or swollen, bulging veins (varicose veins).  You may have pelvic pain because of the weight gain and pregnancy hormones relaxing your joints between the bones in your pelvis. Backaches may result from overexertion of the muscles supporting your posture.  You may have changes in your hair. These can include thickening of your hair, rapid growth, and changes in texture. Some women also have hair loss during or after pregnancy, or hair that feels dry or thin. Your hair will most likely return to normal after your baby is born.  Your breasts will continue to grow and be tender. A yellow discharge may leak from your breasts called colostrum.  Your belly button may stick out.  You may feel short of breath because of your expanding uterus.  You may notice the fetus "dropping," or moving lower in your abdomen.  You may have a bloody mucus discharge. This usually occurs a few days to a week before labor begins.  Your cervix becomes thin and soft (effaced) near your due date. WHAT TO EXPECT AT YOUR PRENATAL EXAMS  You will have prenatal exams every 2 weeks until week 36. Then, you will have weekly prenatal exams. During a routine prenatal visit:  You will be weighed to make sure you and the fetus are growing normally.  Your blood pressure is taken.  Your abdomen will be measured to track your baby's growth.  The fetal heartbeat will be listened to.  Any test results from the previous visit will be discussed.  You may have a cervical check near your due date to see if you have effaced. At around 36 weeks, your caregiver will check your cervix. At the same time, your caregiver will also perform a test on the secretions of the vaginal tissue. This test is to determine if a type of bacteria, Group B  streptococcus, is present. Your caregiver will explain this further. Your caregiver may ask you:  What your birth plan is.  How you are feeling.  If you are feeling the baby move.  If you have had any abnormal symptoms, such as leaking fluid, bleeding, severe headaches, or abdominal cramping.  If you are using any tobacco products, including cigarettes, chewing tobacco, and electronic cigarettes.  If you have any questions. Other tests or screenings that may be performed during your third trimester include:  Blood tests that check for low iron levels (anemia).  Fetal testing to check the health, activity level, and growth of the fetus. Testing is done if you have certain medical conditions or if there are problems during the pregnancy.  HIV (human immunodeficiency virus) testing. If you are at high risk, you may be screened for HIV during your third trimester of pregnancy. FALSE LABOR You may feel small, irregular contractions that eventually go away. These are called Braxton Hicks contractions, or false labor. Contractions may last for hours, days, or even  weeks before true labor sets in. If contractions come at regular intervals, intensify, or become painful, it is best to be seen by your caregiver.  SIGNS OF LABOR   Menstrual-like cramps.  Contractions that are 5 minutes apart or less.  Contractions that start on the top of the uterus and spread down to the lower abdomen and back.  A sense of increased pelvic pressure or back pain.  A watery or bloody mucus discharge that comes from the vagina. If you have any of these signs before the 37th week of pregnancy, call your caregiver right away. You need to go to the hospital to get checked immediately. HOME CARE INSTRUCTIONS   Avoid all smoking, herbs, alcohol, and unprescribed drugs. These chemicals affect the formation and growth of the baby.  Do not use any tobacco products, including cigarettes, chewing tobacco, and  electronic cigarettes. If you need help quitting, ask your health care provider. You may receive counseling support and other resources to help you quit.  Follow your caregiver's instructions regarding medicine use. There are medicines that are either safe or unsafe to take during pregnancy.  Exercise only as directed by your caregiver. Experiencing uterine cramps is a good sign to stop exercising.  Continue to eat regular, healthy meals.  Wear a good support bra for breast tenderness.  Do not use hot tubs, steam rooms, or saunas.  Wear your seat belt at all times when driving.  Avoid raw meat, uncooked cheese, cat litter boxes, and soil used by cats. These carry germs that can cause birth defects in the baby.  Take your prenatal vitamins.  Take 1500-2000 mg of calcium daily starting at the 20th week of pregnancy until you deliver your baby.  Try taking a stool softener (if your caregiver approves) if you develop constipation. Eat more high-fiber foods, such as fresh vegetables or fruit and whole grains. Drink plenty of fluids to keep your urine clear or pale yellow.  Take warm sitz baths to soothe any pain or discomfort caused by hemorrhoids. Use hemorrhoid cream if your caregiver approves.  If you develop varicose veins, wear support hose. Elevate your feet for 15 minutes, 3-4 times a day. Limit salt in your diet.  Avoid heavy lifting, wear low heal shoes, and practice good posture.  Rest a lot with your legs elevated if you have leg cramps or low back pain.  Visit your dentist if you have not gone during your pregnancy. Use a soft toothbrush to brush your teeth and be gentle when you floss.  A sexual relationship may be continued unless your caregiver directs you otherwise.  Do not travel far distances unless it is absolutely necessary and only with the approval of your caregiver.  Take prenatal classes to understand, practice, and ask questions about the labor and  delivery.  Make a trial run to the hospital.  Pack your hospital bag.  Prepare the baby's nursery.  Continue to go to all your prenatal visits as directed by your caregiver. SEEK MEDICAL CARE IF:  You are unsure if you are in labor or if your water has broken.  You have dizziness.  You have mild pelvic cramps, pelvic pressure, or nagging pain in your abdominal area.  You have persistent nausea, vomiting, or diarrhea.  You have a bad smelling vaginal discharge.  You have pain with urination. SEEK IMMEDIATE MEDICAL CARE IF:   You have a fever.  You are leaking fluid from your vagina.  You have spotting or bleeding  from your vagina.  You have severe abdominal cramping or pain.  You have rapid weight loss or gain.  You have shortness of breath with chest pain.  You notice sudden or extreme swelling of your face, hands, ankles, feet, or legs.  You have not felt your baby move in over an hour.  You have severe headaches that do not go away with medicine.  You have vision changes.   This information is not intended to replace advice given to you by your health care provider. Make sure you discuss any questions you have with your health care provider.   Document Released: 09/14/2001 Document Revised: 10/11/2014 Document Reviewed: 11/21/2012 Elsevier Interactive Patient Education 2016 [employer] Inc[employer]  

## 2015-07-29 ENCOUNTER — Ambulatory Visit (INDEPENDENT_AMBULATORY_CARE_PROVIDER_SITE_OTHER): Payer: Medicaid Other | Admitting: Family

## 2015-07-29 ENCOUNTER — Other Ambulatory Visit (HOSPITAL_COMMUNITY)
Admission: RE | Admit: 2015-07-29 | Discharge: 2015-07-29 | Disposition: A | Discharge status: Home / Self Care | Payer: Medicaid Other | Source: Ambulatory Visit | Attending: Family | Admitting: Family

## 2015-07-29 VITALS — BP 112/72 | HR 73 | Temp 98.6°F | Wt 255.6 lb

## 2015-07-29 DIAGNOSIS — Z113 Encounter for screening for infections with a predominantly sexual mode of transmission: Secondary | ICD-10-CM

## 2015-07-29 DIAGNOSIS — N898 Other specified noninflammatory disorders of vagina: Secondary | ICD-10-CM

## 2015-07-29 DIAGNOSIS — O9989 Other specified diseases and conditions complicating pregnancy, childbirth and the puerperium: Secondary | ICD-10-CM

## 2015-07-29 DIAGNOSIS — O0993 Supervision of high risk pregnancy, unspecified, third trimester: Secondary | ICD-10-CM

## 2015-07-29 LAB — POCT URINALYSIS DIP (DEVICE)
Bilirubin Urine: NEGATIVE
GLUCOSE, UA: NEGATIVE mg/dL
HGB URINE DIPSTICK: NEGATIVE
LEUKOCYTES UA: NEGATIVE
NITRITE: NEGATIVE
Protein, ur: NEGATIVE mg/dL
Specific Gravity, Urine: >=1.03 (ref 1.005–1.030)
UROBILINOGEN UA: 2 mg/dL — AB (ref 0.0–1.0)
pH: 5.5 (ref 5.0–8.0)

## 2015-07-29 MED ORDER — COOL MIST HUMIDIFIER MISC: Status: DC

## 2015-07-29 NOTE — Progress Notes (Signed)
Breastfeeding tip of the week reviewed. 

## 2015-07-29 NOTE — Progress Notes (Signed)
Subjective:  Patricia Wise is a 34 y.o. G2P0101 at [redacted]w[redacted]d being seen today for ongoing prenatal care.  Patient reports vaginal irritation.  Contractions: Not present.  Vag. Bleeding: None. Movement: Present. Denies leaking of fluid.   The following portions of the patient's history were reviewed and updated as appropriate: allergies, current medications, past family history, past medical history, past social history, past surgical history and problem list. Problem list updated.  Objective:   Filed Vitals:   07/29/15 0950  BP: 112/72  Pulse: 73  Temp: 98.6 F (37 C)  Weight: 255 lb 9.6 oz (115.939 kg)    Fetal Status: Fetal Heart Rate (bpm): 146 Fundal Height: 37 cm Movement: Present  Presentation: Vertex  General:  Alert, oriented and cooperative. Patient is in no acute distress.  Skin: Skin is warm and dry. No rash noted.   Cardiovascular: Normal heart rate noted  Respiratory: Normal respiratory effort, no problems with respiration noted  Abdomen: Soft, gravid, appropriate for gestational age. Pain/Pressure: Present     Pelvic: Vag. Bleeding: None     Cervical exam deferred        Extremities: Normal range of motion.  Edema: Trace  Mental Status: Normal mood and affect. Normal behavior. Normal judgment and thought content.   Urinalysis: Urine Protein: Negative Urine Glucose: Negative  Assessment and Plan:  Pregnancy: G2P0101 at [redacted]w[redacted]d  1. Supervision of high risk pregnancy, antepartum, third trimester - Culture, beta strep (group b only) - GC/Chlamydia probe amp ([place])not at [facility]  2. Vaginal irritation - Wet prep, genital  3.  Hx of Previous CSection - Scheduled for 08/14/15  Term labor symptoms and general obstetric precautions including but not limited to vaginal bleeding, contractions, leaking of fluid and fetal movement were reviewed in detail with the patient. Please refer to After Visit Summary for other counseling recommendations.  Return in about 1 week  (around 08/05/2015).   Venia Carbon Michiel Cowboy, CNM

## 2015-07-29 NOTE — Addendum Note (Signed)
Addended by: Gwen Pounds on: 07/29/2015 10:36 AM   Modules accepted: Orders

## 2015-07-30 LAB — GC/CHLAMYDIA PROBE AMP (~~LOC~~) NOT AT ARMC
Chlamydia: NEGATIVE
NEISSERIA GONORRHEA: NEGATIVE

## 2015-07-30 LAB — WET PREP, GENITAL
TRICH WET PREP: NONE SEEN
Yeast Wet Prep HPF POC: NONE SEEN

## 2015-07-30 NOTE — H&P (Signed)
  Laneah Luft is an 34 y.o. G2P0101 [redacted]w[redacted]d female.   Chief Complaint: Previous C-section  HPI: Desires elective repeat  Past Medical History  Diagnosis Date  . Vaginal Pap smear, abnormal   . mrsa   . Preterm labor     07/2007    Past Surgical History  Procedure Laterality Date  . Cesarean section    . Gastric bypass    . Breast surgery      augmentation  . Tonsillectomy    . Wrist surgery Bilateral     carpal tunnel    Family History  Problem Relation Age of Onset  . Cancer Mother    Social History:  reports that she quit smoking about 2 years ago. She has never used smokeless tobacco. She reports that she does not drink alcohol or use illicit drugs.   No Known Allergies  No current facility-administered medications on file prior to encounter.   Current Outpatient Prescriptions on File Prior to Encounter  Medication Sig Dispense Refill  . acetaminophen (TYLENOL) 500 MG tablet Take 1,500 mg by mouth 2 (two) times daily as needed for mild pain or headache.    . Prenatal Vit-Fe Fumarate-FA (PRENATAL MULTIVITAMIN) TABS tablet Take 1 tablet by mouth daily at 12 noon. Flintstones chewables    . simethicone (MYLICON) 80 MG chewable tablet Chew 160 mg by mouth every 6 (six) hours as needed for flatulence.      A comprehensive review of systems was negative.  Last menstrual period 11/18/2014. General appearance: alert, cooperative and appears stated age Head: Normocephalic, without obvious abnormality, atraumatic Neck: supple, symmetrical, trachea midline Lungs: normal effort Heart: regular rate and rhythm Abdomen: gravid, NT Extremities: extremities normal, atraumatic, no cyanosis or edema Skin: Skin color, texture, turgor normal. No rashes or lesions Neurologic: Alert and oriented X 3, normal strength and tone. Normal symmetric reflexes. Normal coordination and gait   Lab Results  Component Value Date   WBC 10.8* 06/25/2015   HGB 11.5* 06/25/2015   HCT 34.4*  06/25/2015   MCV 89.8 06/25/2015   PLT 251 06/25/2015         ABO, Rh: O/Positive/-- (04/18 0000)  Antibody: Negative (04/18 0000)  Rubella: !Error!  RPR: NON REAC (09/21 1007)  HBsAg: Negative (04/18 0000)  HIV: NONREACTIVE (09/21 1007)  GBS:       Assessment/Plan Principal Problem:   History of C-section  For elective repeat C-section. Risks include but are not limited to bleeding, infection, injury to surrounding structures, including bowel, bladder and ureters, blood clots, and death.  Likelihood of success is high.   Maude Gloor S 07/30/2015, 11:26 AM

## 2015-07-31 LAB — CULTURE, BETA STREP (GROUP B ONLY)

## 2015-08-05 ENCOUNTER — Ambulatory Visit (INDEPENDENT_AMBULATORY_CARE_PROVIDER_SITE_OTHER): Payer: Medicaid Other | Admitting: Obstetrics and Gynecology

## 2015-08-05 VITALS — BP 129/74 | HR 71 | Temp 98.4°F | Wt 256.5 lb

## 2015-08-05 DIAGNOSIS — O0990 Supervision of high risk pregnancy, unspecified, unspecified trimester: Secondary | ICD-10-CM

## 2015-08-05 LAB — POCT URINALYSIS DIP (DEVICE)
Glucose, UA: NEGATIVE mg/dL
Hgb urine dipstick: NEGATIVE
Leukocytes, UA: NEGATIVE
Nitrite: NEGATIVE
Protein, ur: NEGATIVE mg/dL
Specific Gravity, Urine: >=1.03 (ref 1.005–1.030)
Urobilinogen, UA: 1 mg/dL (ref 0.0–1.0)
pH: 5.5 (ref 5.0–8.0)

## 2015-08-05 NOTE — Progress Notes (Signed)
Patient does not plan on breastfeeding.

## 2015-08-05 NOTE — Progress Notes (Signed)
Subjective:  Patricia Wise is a 35 y.o. G2P0101 at [redacted]w[redacted]d being seen today for ongoing prenatal care.  Patient reports no complaints.  Contractions: Not present.  Vag. Bleeding: None. Movement: Present. Denies leaking of fluid.   The following portions of the patient's history were reviewed and updated as appropriate: allergies, current medications, past family history, past medical history, past social history, past surgical history and problem list. Problem list updated.  Objective:   Filed Vitals:   08/05/15 1050  BP: 129/74  Pulse: 71  Temp: 98.4 F (36.9 C)  Weight: 256 lb 8 oz (116.348 kg)    Fetal Status: Fetal Heart Rate (bpm): 134 Fundal Height: 41 cm Movement: Present     General:  Alert, oriented and cooperative. Patient is in no acute distress.  Skin: Skin is warm and dry. No rash noted.   Cardiovascular: Normal heart rate noted  Respiratory: Normal respiratory effort, no problems with respiration noted  Abdomen: Soft, gravid, appropriate for gestational age. Pain/Pressure: Present     Pelvic: Vag. Bleeding: None     Cervical exam deferred        Extremities: Normal range of motion.  Edema: Trace  Mental Status: Normal mood and affect. Normal behavior. Normal judgment and thought content.   Urinalysis:      Assessment and Plan:  Pregnancy: G2P0101 at [redacted]w[redacted]d  1. Supervision of high risk pregnancy, antepartum, unspecified trimester - c/s scheduled for next Thursday, f/u one week for last prenatal  Term labor symptoms and general obstetric precautions including but not limited to vaginal bleeding, contractions, leaking of fluid and fetal movement were reviewed in detail with the patient. Please refer to After Visit Summary for other counseling recommendations.  Return in about 1 week (around 08/12/2015).   Gwynne Edinger, MD

## 2015-08-12 ENCOUNTER — Encounter (HOSPITAL_COMMUNITY): Payer: Self-pay

## 2015-08-12 ENCOUNTER — Encounter (HOSPITAL_COMMUNITY)
Admission: RE | Admit: 2015-08-12 | Discharge: 2015-08-12 | Disposition: A | Discharge status: Home / Self Care | Payer: Medicaid Other | Source: Ambulatory Visit | Attending: Family Medicine | Admitting: Family Medicine

## 2015-08-12 ENCOUNTER — Other Ambulatory Visit (HOSPITAL_COMMUNITY): Payer: Medicaid Other

## 2015-08-12 ENCOUNTER — Ambulatory Visit (INDEPENDENT_AMBULATORY_CARE_PROVIDER_SITE_OTHER): Payer: Medicaid Other | Admitting: Advanced Practice Midwife

## 2015-08-12 VITALS — BP 125/75 | HR 60 | Temp 98.8°F | Wt 260.5 lb

## 2015-08-12 VITALS — BP 112/77 | HR 60 | Temp 97.2°F | Resp 18 | Ht 66.0 in | Wt 260.3 lb

## 2015-08-12 DIAGNOSIS — Z01818 Encounter for other preprocedural examination: Secondary | ICD-10-CM | POA: Insufficient documentation

## 2015-08-12 DIAGNOSIS — O0993 Supervision of high risk pregnancy, unspecified, third trimester: Secondary | ICD-10-CM

## 2015-08-12 DIAGNOSIS — O26843 Uterine size-date discrepancy, third trimester: Secondary | ICD-10-CM

## 2015-08-12 DIAGNOSIS — Z98891 History of uterine scar from previous surgery: Secondary | ICD-10-CM

## 2015-08-12 DIAGNOSIS — O0990 Supervision of high risk pregnancy, unspecified, unspecified trimester: Secondary | ICD-10-CM

## 2015-08-12 LAB — POCT URINALYSIS DIP (DEVICE)
BILIRUBIN URINE: NEGATIVE
Glucose, UA: NEGATIVE mg/dL
HGB URINE DIPSTICK: NEGATIVE
KETONES UR: NEGATIVE mg/dL
Leukocytes, UA: NEGATIVE
Nitrite: NEGATIVE
PH: 6 (ref 5.0–8.0)
Protein, ur: NEGATIVE mg/dL
Urobilinogen, UA: 2 mg/dL — ABNORMAL HIGH (ref 0.0–1.0)

## 2015-08-12 LAB — CBC
HEMATOCRIT: 36.4 % (ref 36.0–46.0)
HEMOGLOBIN: 12 g/dL (ref 12.0–15.0)
MCH: 29.9 pg (ref 26.0–34.0)
MCHC: 33 g/dL (ref 30.0–36.0)
MCV: 90.8 fL (ref 78.0–100.0)
PLATELETS: 250 10*3/uL (ref 150–400)
RBC: 4.01 MIL/uL (ref 3.87–5.11)
RDW: 13.2 % (ref 11.5–15.5)
WBC: 10.3 10*3/uL (ref 4.0–10.5)

## 2015-08-12 LAB — TYPE AND SCREEN
ABO/RH(D): O POS
Antibody Screen: NEGATIVE

## 2015-08-12 LAB — ABO/RH: ABO/RH(D): O POS

## 2015-08-12 NOTE — Progress Notes (Signed)
Subjective:  Patricia Wise is a 34 y.o. G2P0101 at [redacted]w[redacted]d being seen today for ongoing prenatal care.  Patient reports significant increase in pedal edema since yesterday. denies HA, vision changes, epigastric pain. No previous issues w/ BP. Marland Kitchen  Contractions: Not present.  Vag. Bleeding: None. Movement: Present. Denies leaking of fluid.   The following portions of the patient's history were reviewed and updated as appropriate: allergies, current medications, past family history, past medical history, past social history, past surgical history and problem list. Problem list updated.  Objective:   Filed Vitals:   08/12/15 0957  BP: 125/75  Pulse: 60  Temp: 98.8 F (37.1 C)  Weight: 260 lb 8 oz (118.162 kg)    Fetal Status: Fetal Heart Rate (bpm): 140   Movement: Present     General:  Alert, oriented and cooperative. Patient is in no acute distress.  Skin: Skin is warm and dry. No rash noted.   Cardiovascular: Normal heart rate noted  Respiratory: Normal respiratory effort, no problems with respiration noted  Abdomen: Soft, gravid, appropriate for gestational age. Pain/Pressure: Present     Pelvic: Vag. Bleeding: None     Cervical exam deferred        Extremities: Normal range of motion.  Edema: Mild pitting, slight indentation  Mental Status: Normal mood and affect. Normal behavior. Normal judgment and thought content.   Urinalysis:      Assessment and Plan:  Pregnancy: G2P0101 at [redacted]w[redacted]d  Edema w/ no evidence of GHTN/Pre-E Supervision of normal pregnancy Previous C/S antepartum  There are no diagnoses linked to this encounter. Term labor symptoms and general obstetric precautions including but not limited to vaginal bleeding, contractions, leaking of fluid and fetal movement were reviewed in detail with the patient. Please refer to After Visit Summary for other counseling recommendations.  C/S scheduled 08/14/15. Return in about 8 weeks (around 10/07/2015) for PP.   Manya Silvas, CNM

## 2015-08-12 NOTE — Patient Instructions (Signed)
Cesarean Delivery Cesarean delivery is the birth of a baby through a cut (incision) in the abdomen and womb (uterus).  LET YOUR HEALTH CARE PROVIDER KNOW ABOUT:  All medicines you are taking, including vitamins, herbs, eye drops, creams, and over-the-counter medicines.  Previous problems you or members of your family have had with the use of anesthetics.  Any bleeding or blood clotting disorders you have.  Family history of blood clots or bleeding disorders.  Any history of deep vein thrombosis (DVT) or pulmonary embolism (PE).  Previous surgeries you have had.  Medical conditions you have.  Any allergies you have.  Complicationsinvolving the pregnancy. RISKS AND COMPLICATIONS  Generally, this is a safe procedure. However, as with any procedure, complications can occur. Possible complications include:  Bleeding.  Infection.  Blood clots.  Injury to surrounding organs.  Problems with anesthesia.  Injury to the baby. BEFORE THE PROCEDURE   You may be given an antacid medicine to drink. This will prevent acid contents in your stomach from going into your lungs if you vomit during the surgery.  You may be given an antibiotic medicine to prevent infection. PROCEDURE   To prevent infection of your incision:  Hair may be removed from your pubic area if it is near your incision.  The skin of your pubic area and lower abdomen will be cleaned with a germ-killing solution (antiseptic).  A tube (Foley catheter) will be placed in your bladder to drain your urine from your bladder into a bag. This keeps your bladder empty during surgery.  An IV tube will be placed in your vein.  You may be given medicine to numb the lower half of your body (regional anesthetic). If you were in labor, you may have already had an epidural in place which can be used in both labor and cesarean delivery. You may possibly be given medicine to make you sleep (general anesthetic) though this is not as  common.  Your heart rate and your baby's heart rate will be monitored.  An incision will be made in your abdomen that extends to your uterus. There are 2 basic kinds of incisions:  The horizontal (transverse) incision. Horizontal incisions are from side to side and are used for most routine cesarean deliveries.  The vertical incision. The vertical incision is from the top of the abdomen to the bottom and is less commonly used. It is often done for women who have a serious complication (extreme prematurity) or under emergency situations.  The horizontal and vertical incisions may both be used at the same time. However, this is very uncommon.  An incision is then made in your uterus to deliver the baby.  Your baby will be delivered.  Your health care provider may place the baby on your chest. It is important to keep the baby warm. Your health care provider will dry off the baby, place the baby directly on your bare skin, and cover the baby with warm, dry blankets.  Both incisions will be closed with absorbable stitches. AFTER THE PROCEDURE   If you were awake during the surgery, you will see your baby right away. If you were asleep, you will see your baby as soon as you are awake.  You may breastfeed your baby after surgery.  You may be able to get up and walk the same day as the surgery. If you need to stay in bed for a period of time, you will receive help to turn, cough, and take deep breaths after   surgery. This helps prevent lung problems such as pneumonia.  Do not get out of bed alone the first time after surgery. You will need help getting out of bed until you are able to do this by yourself.  You may be able to shower the day after your cesarean delivery. After the bandage (dressing) is taken off the incision site, a nurse will assist you to shower if you would like help.  You may be directed to take actions to help prevent blood clots in your legs. These may  include:  Walking shortly after surgery, with someone assisting you. Moving around after surgery helps to improve blood flow.  Wearing compression stockings or using different types of devices.  Taking medicines to thin your blood (anticoagulants) if you are at high risk for DVT or PE.  Save any blood clots that you pass from your vagina. If you pass a clot while on the toilet, do not flush it. Call for the nurse. Tell the nurse if you think you are bleeding too much or passing too many clots.  You will be given medicine for pain and nausea as needed. Let your health care providers know if you are hurting. You may also be given an antibiotic to prevent an infection.  Your IV tube will be taken out when you are drinking a reasonable amount of fluids. The Foley catheter is taken out when you are up and walking.  If your blood type is Rh negative and your baby's blood type is Rh positive, you will be given a shot of anti-D immune globulin. This shot prevents you from having Rh problems with a future pregnancy. You should get the shot even if you had your tubes tied (tubal ligation).  If you are allowed to take the baby for a walk, place the baby in the bassinet and push it.   This information is not intended to replace advice given to you by your health care provider. Make sure you discuss any questions you have with your health care provider.   Document Released: 09/20/2005 Document Revised: 06/11/2015 Document Reviewed: 05/17/2012 Elsevier Interactive Patient Education 2016 [employer]  

## 2015-08-12 NOTE — Patient Instructions (Signed)
Your procedure is scheduled on:  August 14, 15    Enter through the Main Entrance of [facility] at: 11:00 am   Pick up the phone at the desk and dial [phone].  Call this number if you have problems the morning of surgery: [phone].  Remember: Do NOT eat food: after midnight on Wednesday night  Do NOT drink clear liquids after:  8:30 am day of surgery  Take these medicines the morning of surgery with a SIP OF WATER:  None   Do NOT wear jewelry (body piercing), metal hair clips/bobby pins, or nail polish. Do NOT wear lotions, powders, or perfumes.  You may wear deoderant. Do NOT shave for 48 hours prior to surgery. Do NOT bring valuables to the hospital. Leave suitcase in car.  After surgery it may be brought to your room.  For patients admitted to the hospital, checkout time is 11:00 AM the day of discharge.

## 2015-08-13 LAB — RPR: RPR: NONREACTIVE

## 2015-08-13 NOTE — Anesthesia Preprocedure Evaluation (Addendum)
Anesthesia Evaluation  Patient identified by MRN, date of birth, ID band Patient awake    Reviewed: Allergy & Precautions, NPO status , Patient's Chart, lab work & pertinent test results  History of Anesthesia Complications Negative for: history of anesthetic complications  Airway Mallampati: II  TM Distance: >3 FB Neck ROM: Full    Dental no notable dental hx. (+) Dental Advisory Given   Pulmonary former smoker,    Pulmonary exam normal breath sounds clear to auscultation       Cardiovascular negative cardio ROS Normal cardiovascular exam Rhythm:Regular Rate:Normal     Neuro/Psych negative neurological ROS  negative psych ROS   GI/Hepatic negative GI ROS, Neg liver ROS,   Endo/Other  Morbid obesity  Renal/GU negative Renal ROS  negative genitourinary   Musculoskeletal negative musculoskeletal ROS (+)   Abdominal   Peds negative pediatric ROS (+)  Hematology negative hematology ROS (+)   Anesthesia Other Findings   Reproductive/Obstetrics (+) Pregnancy                            Anesthesia Physical Anesthesia Plan  ASA: III  Anesthesia Plan: Spinal   Post-op Pain Management:    Induction:   Airway Management Planned:   Additional Equipment:   Intra-op Plan:   Post-operative Plan:   Informed Consent: I have reviewed the patients History and Physical, chart, labs and discussed the procedure including the risks, benefits and alternatives for the proposed anesthesia with the patient or authorized representative who has indicated his/her understanding and acceptance.   Dental advisory given  Plan Discussed with:   Anesthesia Plan Comments:         Anesthesia Quick Evaluation

## 2015-08-14 ENCOUNTER — Encounter (HOSPITAL_COMMUNITY)
Admission: RE | Disposition: A | Discharge status: Home / Self Care | Payer: Self-pay | Source: Ambulatory Visit | Attending: Obstetrics and Gynecology

## 2015-08-14 ENCOUNTER — Inpatient Hospital Stay (HOSPITAL_COMMUNITY): Payer: Medicaid Other | Admitting: Anesthesiology

## 2015-08-14 ENCOUNTER — Inpatient Hospital Stay (HOSPITAL_COMMUNITY)
Admission: RE | Admit: 2015-08-14 | Discharge: 2015-08-16 | DRG: 765 | Disposition: A | Discharge status: Home / Self Care | Payer: Medicaid Other | Source: Ambulatory Visit | Attending: Obstetrics and Gynecology | Admitting: Obstetrics and Gynecology

## 2015-08-14 ENCOUNTER — Encounter (HOSPITAL_COMMUNITY): Payer: Self-pay | Admitting: *Deleted

## 2015-08-14 DIAGNOSIS — O34211 Maternal care for low transverse scar from previous cesarean delivery: Secondary | ICD-10-CM | POA: Diagnosis not present

## 2015-08-14 DIAGNOSIS — O34219 Maternal care for unspecified type scar from previous cesarean delivery: Secondary | ICD-10-CM | POA: Diagnosis present

## 2015-08-14 DIAGNOSIS — Z87891 Personal history of nicotine dependence: Secondary | ICD-10-CM

## 2015-08-14 DIAGNOSIS — Z3490 Encounter for supervision of normal pregnancy, unspecified, unspecified trimester: Secondary | ICD-10-CM

## 2015-08-14 DIAGNOSIS — Z6841 Body Mass Index (BMI) 40.0 and over, adult: Secondary | ICD-10-CM

## 2015-08-14 DIAGNOSIS — O99214 Obesity complicating childbirth: Secondary | ICD-10-CM | POA: Diagnosis present

## 2015-08-14 DIAGNOSIS — O99844 Bariatric surgery status complicating childbirth: Secondary | ICD-10-CM | POA: Diagnosis present

## 2015-08-14 DIAGNOSIS — Z3A39 39 weeks gestation of pregnancy: Secondary | ICD-10-CM

## 2015-08-14 DIAGNOSIS — Z98891 History of uterine scar from previous surgery: Secondary | ICD-10-CM

## 2015-08-14 SURGERY — Surgical Case
Anesthesia: Spinal | Site: Abdomen

## 2015-08-14 MED ORDER — OXYTOCIN 40 UNITS IN LACTATED RINGERS INFUSION - SIMPLE MED: 62.5000 mL/h | INTRAVENOUS | Status: AC

## 2015-08-14 MED ORDER — FENTANYL CITRATE (PF) 100 MCG/2ML IJ SOLN
INTRAMUSCULAR | Status: DC | PRN
  Administered 2015-08-14: 10 ug via INTRATHECAL

## 2015-08-14 MED ORDER — BUPIVACAINE HCL (PF) 0.25 % IJ SOLN
INTRAMUSCULAR | Status: DC | PRN
  Administered 2015-08-14: 30 mL

## 2015-08-14 MED ORDER — OXYTOCIN 10 UNIT/ML IJ SOLN
INTRAMUSCULAR | Status: AC
  Filled 2015-08-14: qty 4

## 2015-08-14 MED ORDER — CEFAZOLIN SODIUM-DEXTROSE 2-3 GM-% IV SOLR
2.0000 g | INTRAVENOUS | Status: AC
  Administered 2015-08-14: 2 g via INTRAVENOUS

## 2015-08-14 MED ORDER — DIBUCAINE 1 % RE OINT: 1.0000 "application " | TOPICAL_OINTMENT | RECTAL | Status: DC | PRN

## 2015-08-14 MED ORDER — SCOPOLAMINE 1 MG/3DAYS TD PT72: 1.0000 | MEDICATED_PATCH | Freq: Once | TRANSDERMAL | Status: DC

## 2015-08-14 MED ORDER — NALBUPHINE HCL 10 MG/ML IJ SOLN
5.0000 mg | Freq: Once | INTRAMUSCULAR | Status: AC | PRN
  Administered 2015-08-14: 5 mg via SUBCUTANEOUS

## 2015-08-14 MED ORDER — WITCH HAZEL-GLYCERIN EX PADS: 1.0000 "application " | MEDICATED_PAD | CUTANEOUS | Status: DC | PRN

## 2015-08-14 MED ORDER — DIPHENHYDRAMINE HCL 25 MG PO CAPS: 25.0000 mg | ORAL_CAPSULE | ORAL | Status: DC | PRN

## 2015-08-14 MED ORDER — DIPHENHYDRAMINE HCL 50 MG/ML IJ SOLN: 12.5000 mg | INTRAMUSCULAR | Status: DC | PRN

## 2015-08-14 MED ORDER — PHENYLEPHRINE 8 MG IN D5W 100 ML (0.08MG/ML) PREMIX OPTIME
INJECTION | INTRAVENOUS | Status: DC | PRN
  Administered 2015-08-14: 60 ug/min via INTRAVENOUS

## 2015-08-14 MED ORDER — ONDANSETRON HCL 4 MG/2ML IJ SOLN
INTRAMUSCULAR | Status: AC
  Filled 2015-08-14: qty 2

## 2015-08-14 MED ORDER — SCOPOLAMINE 1 MG/3DAYS TD PT72
1.0000 | MEDICATED_PATCH | Freq: Once | TRANSDERMAL | Status: DC
  Administered 2015-08-14: 1.5 mg via TRANSDERMAL

## 2015-08-14 MED ORDER — DIPHENHYDRAMINE HCL 25 MG PO CAPS: 25.0000 mg | ORAL_CAPSULE | Freq: Four times a day (QID) | ORAL | Status: DC | PRN

## 2015-08-14 MED ORDER — NALBUPHINE HCL 10 MG/ML IJ SOLN: 5.0000 mg | INTRAMUSCULAR | Status: DC | PRN

## 2015-08-14 MED ORDER — OXYCODONE-ACETAMINOPHEN 5-325 MG PO TABS
2.0000 | ORAL_TABLET | ORAL | Status: DC | PRN
  Administered 2015-08-14 – 2015-08-15 (×5): 2 via ORAL
  Filled 2015-08-14 (×5): qty 2

## 2015-08-14 MED ORDER — MENTHOL 3 MG MT LOZG: 1.0000 | LOZENGE | OROMUCOSAL | Status: DC | PRN

## 2015-08-14 MED ORDER — FENTANYL CITRATE (PF) 100 MCG/2ML IJ SOLN
25.0000 ug | INTRAMUSCULAR | Status: DC | PRN
Start: 2015-08-14 — End: 2015-08-14
  Administered 2015-08-14: 50 ug via INTRAVENOUS

## 2015-08-14 MED ORDER — NALBUPHINE HCL 10 MG/ML IJ SOLN: 5.0000 mg | Freq: Once | INTRAMUSCULAR | Status: AC | PRN

## 2015-08-14 MED ORDER — SODIUM CHLORIDE 0.9 % IR SOLN
Status: DC | PRN
  Administered 2015-08-14: 1000 mL

## 2015-08-14 MED ORDER — NALOXONE HCL 0.4 MG/ML IJ SOLN: 0.4000 mg | INTRAMUSCULAR | Status: DC | PRN

## 2015-08-14 MED ORDER — KETOROLAC TROMETHAMINE 30 MG/ML IJ SOLN
INTRAMUSCULAR | Status: AC
  Filled 2015-08-14: qty 1

## 2015-08-14 MED ORDER — LANOLIN HYDROUS EX OINT: 1.0000 "application " | TOPICAL_OINTMENT | CUTANEOUS | Status: DC | PRN

## 2015-08-14 MED ORDER — ZOLPIDEM TARTRATE 5 MG PO TABS: 5.0000 mg | ORAL_TABLET | Freq: Every evening | ORAL | Status: DC | PRN

## 2015-08-14 MED ORDER — KETOROLAC TROMETHAMINE 30 MG/ML IJ SOLN
30.0000 mg | Freq: Four times a day (QID) | INTRAMUSCULAR | Status: AC | PRN
  Administered 2015-08-14: 30 mg via INTRAMUSCULAR

## 2015-08-14 MED ORDER — LACTATED RINGERS IV SOLN
INTRAVENOUS | Status: DC
  Administered 2015-08-14: 12:00:00 via INTRAVENOUS

## 2015-08-14 MED ORDER — CEFAZOLIN SODIUM-DEXTROSE 2-3 GM-% IV SOLR
INTRAVENOUS | Status: AC
  Filled 2015-08-14: qty 50

## 2015-08-14 MED ORDER — KETOROLAC TROMETHAMINE 30 MG/ML IJ SOLN: 30.0000 mg | Freq: Four times a day (QID) | INTRAMUSCULAR | Status: AC | PRN

## 2015-08-14 MED ORDER — FENTANYL CITRATE (PF) 100 MCG/2ML IJ SOLN
INTRAMUSCULAR | Status: AC
  Filled 2015-08-14: qty 2

## 2015-08-14 MED ORDER — PHENYLEPHRINE 8 MG IN D5W 100 ML (0.08MG/ML) PREMIX OPTIME
INJECTION | INTRAVENOUS | Status: AC
  Filled 2015-08-14: qty 100

## 2015-08-14 MED ORDER — NALBUPHINE HCL 10 MG/ML IJ SOLN
INTRAMUSCULAR | Status: AC
  Filled 2015-08-14: qty 1

## 2015-08-14 MED ORDER — LACTATED RINGERS IV SOLN
Freq: Once | INTRAVENOUS | Status: AC
  Administered 2015-08-14: 12:00:00 via INTRAVENOUS

## 2015-08-14 MED ORDER — SODIUM CHLORIDE 0.9 % IJ SOLN: 3.0000 mL | INTRAMUSCULAR | Status: DC | PRN

## 2015-08-14 MED ORDER — IBUPROFEN 600 MG PO TABS
600.0000 mg | ORAL_TABLET | Freq: Four times a day (QID) | ORAL | Status: DC
  Filled 2015-08-14 (×3): qty 1

## 2015-08-14 MED ORDER — FENTANYL CITRATE (PF) 100 MCG/2ML IJ SOLN
INTRAMUSCULAR | Status: AC
  Filled 2015-08-14: qty 4

## 2015-08-14 MED ORDER — PRENATAL MULTIVITAMIN CH
1.0000 | ORAL_TABLET | Freq: Every day | ORAL | Status: DC
  Administered 2015-08-15 – 2015-08-16 (×2): 1 via ORAL
  Filled 2015-08-14 (×2): qty 1

## 2015-08-14 MED ORDER — ACETAMINOPHEN 325 MG PO TABS: 650.0000 mg | ORAL_TABLET | ORAL | Status: DC | PRN

## 2015-08-14 MED ORDER — ONDANSETRON HCL 4 MG/2ML IJ SOLN
INTRAMUSCULAR | Status: DC | PRN
  Administered 2015-08-14: 4 mg via INTRAVENOUS

## 2015-08-14 MED ORDER — LACTATED RINGERS IV SOLN
INTRAVENOUS | Status: DC
Start: 2015-08-14 — End: 2015-08-16
  Administered 2015-08-14: 125 mL/h via INTRAVENOUS

## 2015-08-14 MED ORDER — OXYCODONE-ACETAMINOPHEN 5-325 MG PO TABS: 1.0000 | ORAL_TABLET | ORAL | Status: DC | PRN

## 2015-08-14 MED ORDER — SIMETHICONE 80 MG PO CHEW
80.0000 mg | CHEWABLE_TABLET | Freq: Three times a day (TID) | ORAL | Status: DC
  Administered 2015-08-14 – 2015-08-16 (×5): 80 mg via ORAL
  Filled 2015-08-14 (×6): qty 1

## 2015-08-14 MED ORDER — SENNOSIDES-DOCUSATE SODIUM 8.6-50 MG PO TABS
2.0000 | ORAL_TABLET | ORAL | Status: DC
  Administered 2015-08-15 – 2015-08-16 (×2): 2 via ORAL
  Filled 2015-08-14 (×2): qty 2

## 2015-08-14 MED ORDER — ONDANSETRON HCL 4 MG/2ML IJ SOLN: 4.0000 mg | Freq: Once | INTRAMUSCULAR | Status: DC | PRN

## 2015-08-14 MED ORDER — SIMETHICONE 80 MG PO CHEW
80.0000 mg | CHEWABLE_TABLET | ORAL | Status: DC | PRN
  Filled 2015-08-14: qty 1

## 2015-08-14 MED ORDER — SCOPOLAMINE 1 MG/3DAYS TD PT72
MEDICATED_PATCH | TRANSDERMAL | Status: AC
  Administered 2015-08-14: 1.5 mg via TRANSDERMAL
  Filled 2015-08-14: qty 1

## 2015-08-14 MED ORDER — TETANUS-DIPHTH-ACELL PERTUSSIS 5-2.5-18.5 LF-MCG/0.5 IM SUSP: 0.5000 mL | Freq: Once | INTRAMUSCULAR | Status: DC

## 2015-08-14 MED ORDER — ONDANSETRON HCL 4 MG/2ML IJ SOLN: 4.0000 mg | Freq: Three times a day (TID) | INTRAMUSCULAR | Status: DC | PRN

## 2015-08-14 MED ORDER — MEPERIDINE HCL 25 MG/ML IJ SOLN: 6.2500 mg | INTRAMUSCULAR | Status: DC | PRN

## 2015-08-14 MED ORDER — LACTATED RINGERS IV SOLN
INTRAVENOUS | Status: DC
  Administered 2015-08-14: 13:00:00 via INTRAVENOUS

## 2015-08-14 MED ORDER — NALOXONE HCL 2 MG/2ML IJ SOSY
1.0000 ug/kg/h | PREFILLED_SYRINGE | INTRAVENOUS | Status: DC | PRN
  Filled 2015-08-14: qty 2

## 2015-08-14 MED ORDER — OXYTOCIN 10 UNIT/ML IJ SOLN
40.0000 [IU] | INTRAMUSCULAR | Status: DC | PRN
  Administered 2015-08-14: 40 [IU] via INTRAVENOUS

## 2015-08-14 MED ORDER — BUPIVACAINE IN DEXTROSE 0.75-8.25 % IT SOLN
INTRATHECAL | Status: DC | PRN
  Administered 2015-08-14: 1.6 mL via INTRATHECAL

## 2015-08-14 MED ORDER — MORPHINE SULFATE (PF) 0.5 MG/ML IJ SOLN
INTRAMUSCULAR | Status: DC | PRN
  Administered 2015-08-14: .2 mg via INTRATHECAL

## 2015-08-14 MED ORDER — SIMETHICONE 80 MG PO CHEW
80.0000 mg | CHEWABLE_TABLET | ORAL | Status: DC
  Administered 2015-08-15 – 2015-08-16 (×2): 80 mg via ORAL
  Filled 2015-08-14 (×2): qty 1

## 2015-08-14 MED ORDER — MORPHINE SULFATE (PF) 0.5 MG/ML IJ SOLN
INTRAMUSCULAR | Status: AC
  Filled 2015-08-14: qty 100

## 2015-08-14 SURGICAL SUPPLY — 30 items
BENZOIN TINCTURE PRP APPL 2/3 (GAUZE/BANDAGES/DRESSINGS) ×2 IMPLANT
CLAMP CORD UMBIL (MISCELLANEOUS) IMPLANT
CLOTH BEACON ORANGE TIMEOUT ST (SAFETY) ×2 IMPLANT
DRAPE SHEET LG 3/4 BI-LAMINATE (DRAPES) IMPLANT
DRSG OPSITE POSTOP 4X10 (GAUZE/BANDAGES/DRESSINGS) ×2 IMPLANT
DURAPREP 26ML APPLICATOR (WOUND CARE) ×2 IMPLANT
ELECT REM PT RETURN 9FT ADLT (ELECTROSURGICAL) ×2
ELECTRODE REM PT RTRN 9FT ADLT (ELECTROSURGICAL) ×1 IMPLANT
EXTRACTOR VACUUM M CUP 4 TUBE (SUCTIONS) IMPLANT
GLOVE BIOGEL PI IND STRL 7.0 (GLOVE) ×2 IMPLANT
GLOVE BIOGEL PI INDICATOR 7.0 (GLOVE) ×2
GLOVE ECLIPSE 7.0 STRL STRAW (GLOVE) ×4 IMPLANT
GOWN STRL REUS W/TWL LRG LVL3 (GOWN DISPOSABLE) ×4 IMPLANT
HEMOSTAT SURGICEL 4X8 (HEMOSTASIS) ×2 IMPLANT
KIT ABG SYR 3ML LUER SLIP (SYRINGE) IMPLANT
NEEDLE HYPO 22GX1.5 SAFETY (NEEDLE) ×2 IMPLANT
NEEDLE HYPO 25X5/8 SAFETYGLIDE (NEEDLE) IMPLANT
NS IRRIG 1000ML POUR BTL (IV SOLUTION) ×2 IMPLANT
PACK C SECTION WH (CUSTOM PROCEDURE TRAY) ×2 IMPLANT
PAD ABD 7.5X8 STRL (GAUZE/BANDAGES/DRESSINGS) ×4 IMPLANT
PAD OB MATERNITY 4.3X12.25 (PERSONAL CARE ITEMS) ×2 IMPLANT
PENCIL SMOKE EVAC W/HOLSTER (ELECTROSURGICAL) ×2 IMPLANT
RTRCTR C-SECT PINK 25CM LRG (MISCELLANEOUS) ×2 IMPLANT
STRIP CLOSURE SKIN 1/2X4 (GAUZE/BANDAGES/DRESSINGS) ×2 IMPLANT
SUT VIC AB 0 CTX 36 (SUTURE) ×3
SUT VIC AB 0 CTX36XBRD ANBCTRL (SUTURE) ×3 IMPLANT
SUT VIC AB 4-0 KS 27 (SUTURE) IMPLANT
SYR 30ML LL (SYRINGE) ×2 IMPLANT
TOWEL OR 17X24 6PK STRL BLUE (TOWEL DISPOSABLE) ×2 IMPLANT
TRAY FOLEY CATH SILVER 14FR (SET/KITS/TRAYS/PACK) ×2 IMPLANT

## 2015-08-14 NOTE — Interval H&P Note (Signed)
History and Physical Interval Note:  08/14/2015 12:11 PM  Patricia Wise  has presented today for surgery, with the diagnosis of  REPEAT c/section  The various methods of treatment have been discussed with the patient and family. After consideration of risks, benefits and other options for treatment, the patient has consented to  Procedure(s) with comments: REPEAT CESAREAN SECTION (N/A) - Requested 08/14/15 @ 12:30p as a surgical intervention .  The patient's history has been reviewed, patient examined, no change in status, stable for surgery.  I have reviewed the patient's chart and labs.  Questions were answered to the patient's satisfaction.     [place]

## 2015-08-14 NOTE — Anesthesia Postprocedure Evaluation (Signed)
  Anesthesia Post-op Note  Patient: Patricia Wise  Procedure(s) Performed: Procedure(s) with comments: REPEAT CESAREAN SECTION (N/A) - Requested 08/14/15 @ 12:30p  Patient Location: PACU and Mother/Baby  Anesthesia Type:Spinal  Level of Consciousness: awake, alert  and oriented  Airway and Oxygen Therapy: Patient Spontanous Breathing  Post-op Pain: mild  Post-op Assessment: Post-op Vital signs reviewed              Post-op Vital Signs: Reviewed and stable  Last Vitals:  Filed Vitals:   08/14/15 1823  BP: 117/80  Pulse: 55  Temp: 36.8 C  Resp: 18    Complications: No apparent anesthesia complications

## 2015-08-14 NOTE — Progress Notes (Signed)
Foster notified that pt's pain at a 6.  Order received to give pt 2 percocets

## 2015-08-14 NOTE — Anesthesia Procedure Notes (Signed)
Spinal Patient location during procedure: OR Staffing Anesthesiologist: Kenlea Woodell Performed by: anesthesiologist  Preanesthetic Checklist Completed: patient identified, site marked, surgical consent, pre-op evaluation, timeout performed, IV checked, risks and benefits discussed and monitors and equipment checked Spinal Block Patient position: sitting Prep: ChloraPrep Patient monitoring: continuous pulse ox, blood pressure and heart rate Approach: midline Location: L3-4 Injection technique: single-shot Needle Needle type: Sprotte  Needle gauge: 24 G Needle length: 9 cm Additional Notes Functioning IV was confirmed and monitors were applied. Sterile prep and drape, including hand hygiene, mask and sterile gloves were used. The patient was positioned and the spine was prepped. The skin was anesthetized with lidocaine.  Free flow of clear CSF was obtained prior to injecting local anesthetic into the CSF.  The spinal needle aspirated freely following injection.  The needle was carefully withdrawn.  The patient tolerated the procedure well. Consent was obtained prior to procedure with all questions answered and concerns addressed. Risks including but not limited to bleeding, infection, nerve damage, paralysis, failed block, inadequate analgesia, allergic reaction, high spinal, itching and headache were discussed and the patient wished to proceed.   Dilon Lank, MD     

## 2015-08-14 NOTE — Transfer of Care (Signed)
Immediate Anesthesia Transfer of Care Note  Patient: Patricia Wise  Procedure(s) Performed: Procedure(s) with comments: REPEAT CESAREAN SECTION (N/A) - Requested 08/14/15 @ 12:30p  Patient Location: PACU  Anesthesia Type:Spinal  Level of Consciousness: awake, alert  and oriented  Airway & Oxygen Therapy: Patient Spontanous Breathing  Post-op Assessment: Report given to RN and Post -op Vital signs reviewed and stable  Post vital signs: Reviewed and stable  Last Vitals:  Filed Vitals:   08/14/15 1041  BP: 124/89  Pulse: 72  Temp: [id number] C    Complications: No apparent anesthesia complications

## 2015-08-14 NOTE — Op Note (Signed)
Cesarean Section Operative Report  Patricia Wise  08/14/2015  Indications: Scheduled Proceedure/Maternal Request   Pre-operative Diagnosis:  elective REPEAT c/section.   Post-operative Diagnosis: Same   Surgeon: Surgeon(s) and Role:    * Donnamae Jude, MD - Primary    * Gwynne Edinger, MD - Assisting   Attending Attestation: I was present and scrubbed for the entire procedure.   Assistants: Medical student [place]  Anesthesia: spinal    Estimated Blood Loss: 500 ml  Total IV Fluids: 1500 ml   Urine Output:: 200 ml clear yellow urine  Specimens: none  Findings: Viable female3 infant in cephalic presentation; Apgars 9/9; weight 3460 g; arterial cord pH not obtained; clear amniotic fluid; intact placenta with three vessel cord; normal uterus, fallopian tubes and ovaries bilaterally.  Baby condition / location:  Couplet care / Skin to Skin   Complications: no complications  Indications: Patricia Wise is a 34 y.o. G2P0101 with an IUP [redacted]w[redacted]d presenting for elective scheduled repeat cesarean section.  The risks, benefits, complications, treatment options, and expected outcomes were discussed with the patient . The patient concurred with the proposed plan, giving informed consent. identified as Patricia Wise and the procedure verified as C-Section Delivery.  Procedure Details:  The patient was taken back to the operative suite where spinal anesthesia was placed.  A time out was held and the above information confirmed.   After induction of anesthesia, the patient was draped and prepped in the usual sterile manner and placed in a dorsal supine position with a leftward tilt. A Pfannenstiel incision was made and carried down through the subcutaneous tissue to the fascia. Fascial incision was made and sharply extended transversely. The fascia was separated from the underlying rectus tissue superiorly and inferiorly. A bleeding penetrating branch of the epigastrics  was identified and cauterized. The peritoneum was identified and bluntly. The peritoneum was adherent to the uterus and a portion of the serosa was interrupted during blunt dissection.   Alexis retractor was placed. A low transverse uterine incision was made and extended bluntly. Delivered from cephalic presentation was a viable infant with Apgars and weight as above. The umbilical cord was clamped and cut cord blood was obtained for evaluation. Cord ph was not sent. The placenta was removed Intact and appeared normal. The uterine outline, tubes and ovaries appeared normal}. The uterine incision was closed with running locked sutures of 0Vicryl. There was oozing overlying the bladder as well as in the portion of the serosa that was interrupted superior to the hysterotomy incision. After direct pressure surgicel was placed and hemostasis observed. We did not place an inbricating layer. The peritoneum was closed with 0 vicryl. The rectus muscles were examined and hemostasis observed. The fascia was then reapproximated with running sutures of 0Vicryl. No subcuticular closure. The skin was closed with 4-0Vicryl.   Instrument, sponge, and needle counts were correct prior the abdominal closure and were correct at the conclusion of the case.     Disposition: PACU - hemodynamically stable.   Maternal Condition: stable       Signed: Patsy Lager WoukMD 08/14/2015 1:33 PM

## 2015-08-14 NOTE — Anesthesia Postprocedure Evaluation (Signed)
   Anesthesia Post-op Note  Patient: Patricia Wise  Procedure(s) Performed: Procedure(s) (LRB): REPEAT CESAREAN SECTION (N/A)  Patient Location: PACU  Anesthesia Type: Spinal  Level of Consciousness: awake and alert   Airway and Oxygen Therapy: Patient Spontanous Breathing  Post-op Pain: mild  Post-op Assessment: Post-op Vital signs reviewed, Patient's Cardiovascular Status Stable, Respiratory Function Stable, Patent Airway and No signs of Nausea or vomiting  Last Vitals:  Filed Vitals:   08/14/15 1415  BP: 115/72  Pulse: 56  Temp:   Resp: 15    Post-op Vital Signs: stable   Complications: No apparent anesthesia complications

## 2015-08-14 NOTE — Addendum Note (Signed)
Addendum  created 08/14/15 2056 by Genevie Ann, CRNA   Modules edited: Notes Section   Notes Section:  File: [id number]

## 2015-08-14 NOTE — Consult Note (Signed)
Neonatology Note:   Attendance at C-section:    I was asked by Dr. Kennon Rounds to attend this repeat C/S at term. The mother is a G2P1 O pos, GBS neg with an uncomplicated pregnancy. ROM at delivery, fluid clear. Infant vigorous with good spontaneous cry and tone. Needed only minimal bulb suctioning. Ap 9/9. Lungs clear to ausc in DR. To CN to care of Pediatrician.   Real Cons, MD

## 2015-08-15 ENCOUNTER — Encounter (HOSPITAL_COMMUNITY): Payer: Self-pay | Admitting: Family Medicine

## 2015-08-15 DIAGNOSIS — Z3490 Encounter for supervision of normal pregnancy, unspecified, unspecified trimester: Secondary | ICD-10-CM

## 2015-08-15 LAB — CBC
HEMATOCRIT: 33.2 % — AB (ref 36.0–46.0)
Hemoglobin: 11 g/dL — ABNORMAL LOW (ref 12.0–15.0)
MCH: 29.7 pg (ref 26.0–34.0)
MCHC: 33.1 g/dL (ref 30.0–36.0)
MCV: 89.7 fL (ref 78.0–100.0)
Platelets: 204 10*3/uL (ref 150–400)
RBC: 3.7 MIL/uL — AB (ref 3.87–5.11)
RDW: 13.2 % (ref 11.5–15.5)
WBC: 12.4 10*3/uL — AB (ref 4.0–10.5)

## 2015-08-15 LAB — BIRTH TISSUE RECOVERY COLLECTION (PLACENTA DONATION)

## 2015-08-15 MED ORDER — HYDROCODONE-ACETAMINOPHEN 5-325 MG PO TABS
1.0000 | ORAL_TABLET | ORAL | Status: DC | PRN
  Administered 2015-08-15: 1 via ORAL

## 2015-08-15 MED ORDER — HYDROCODONE-ACETAMINOPHEN 5-325 MG PO TABS: 1.0000 | ORAL_TABLET | ORAL | Status: DC | PRN

## 2015-08-15 MED ORDER — HYDROCORTISONE 1 % EX CREA
TOPICAL_CREAM | Freq: Three times a day (TID) | CUTANEOUS | Status: DC | PRN
  Administered 2015-08-16: 1 via TOPICAL
  Filled 2015-08-15 (×2): qty 28

## 2015-08-15 MED ORDER — HYDROCODONE-ACETAMINOPHEN 5-325 MG PO TABS
2.0000 | ORAL_TABLET | ORAL | Status: DC | PRN
  Administered 2015-08-15 – 2015-08-16 (×4): 2 via ORAL
  Filled 2015-08-15 (×5): qty 2

## 2015-08-15 MED ORDER — HYDROCODONE-ACETAMINOPHEN 5-325 MG PO TABS: 2.0000 | ORAL_TABLET | ORAL | Status: DC | PRN

## 2015-08-15 NOTE — Progress Notes (Signed)
UR chart review completed.  

## 2015-08-15 NOTE — Progress Notes (Signed)
Post Partum Day 1 Subjective:  Patricia Wise is a 34 y.o. G2P1102 [redacted]w[redacted]d s/p RLTCS.  No acute events overnight.  Pt denies problems with ambulating, or po intake. She has not voided since her foley cath was removed 3 hrs ago.  She denies nausea or vomiting.  Pain is well controlled.  She has not had flatus.  Lochia Minimal.  Plan for birth control is IUD.  Method of Feeding: Bottle  Objective: Blood pressure 104/52, pulse 59, temperature 98 F (36.7 C), temperature source Oral, resp. rate 16, last menstrual period 11/18/2014, SpO2 99 %, unknown if currently breastfeeding.  Physical Exam:  General: alert, cooperative, and no distress Lochia: normal flow Chest: normal WOB Heart: Regular rate Abdomen: +BS, soft, mild TTP (appropriate) Uterine Fundus: firm, non tender, below the umbilicus DVT Evaluation: No evidence of DVT seen on physical exam. Extremities: trace edema (feet)    Recent Labs  08/12/15 1110 08/15/15 0540  HGB 12.0 11.0*  HCT 36.4 33.2*    Assessment/Plan:  ASSESSMENT: Patricia Wise is a 34 y.o. [id number] [redacted]w[redacted]d s/p RLTCS who is doing well.  Monitor for urine output.  Plan for discharge tomorrow Continue routine PP care   LOS: 1 day   Patricia Wise 08/15/2015, 7:47 AM   I have seen and examined this patient and agree the above assessment.  Respiratory effort normal, lochia appropriate, legs negative,  pain level normal.  CRESENZO-DISHMAN,Johnhenry Tippin 08/19/2015 10:23 AM

## 2015-08-15 NOTE — Progress Notes (Signed)
   08/15/15 0830  Integumentary (WDL)  Integumentary (WDL) X  Skin Color Appropriate for ethnicity  Skin Condition Dry  Skin Integrity Surgical Incision (see LDA);Skin tear (2,2cm skin tear R abd; (adhesive) vaseline/gauze applied )

## 2015-08-15 NOTE — Progress Notes (Signed)
Post Partum Day 1/POD 1 RLTCS Subjective: no complaints, up ad lib, voiding and tolerating PO, small lochia, plans to bottle feed, IUD  Objective: Blood pressure 104/52, pulse 59, temperature 98 F (36.7 C), temperature source Oral, resp. rate 16, last menstrual period 11/18/2014, SpO2 99 %, unknown if currently breastfeeding.  Physical Exam:  General: alert, cooperative and no distress Lochia:normal flow Chest: CTAB Heart: RRR no m/r/g Abdomen: +BS, soft, nontender, dsg dry/intact Uterine Fundus: firm DVT Evaluation: No evidence of DVT seen on physical exam. Extremities: trace edema   Recent Labs  08/12/15 1110 08/15/15 0540  HGB 12.0 11.0*  HCT 36.4 33.2*    Assessment/Plan:  Routine post op/postpartum care   LOS: 1 day   Patricia Wise,Patricia Patricia Wise 08/15/2015, 7:40 AM

## 2015-08-16 MED ORDER — MUPIROCIN 2 % EX OINT
TOPICAL_OINTMENT | CUTANEOUS | Status: DC | PRN
  Administered 2015-08-16: 05:00:00 via TOPICAL
  Filled 2015-08-16: qty 22

## 2015-08-16 MED ORDER — IBUPROFEN 600 MG PO TABS: 600.0000 mg | ORAL_TABLET | Freq: Four times a day (QID) | ORAL | Status: DC

## 2015-08-16 MED ORDER — MUPIROCIN CALCIUM 2 % EX CREA
TOPICAL_CREAM | CUTANEOUS | Status: DC | PRN
  Filled 2015-08-16: qty 15

## 2015-08-16 MED ORDER — HYDROCODONE-ACETAMINOPHEN 5-325 MG PO TABS
1.0000 | ORAL_TABLET | ORAL | Status: DC | PRN
Start: 2015-08-16 — End: 2015-08-25

## 2015-08-16 NOTE — Discharge Summary (Signed)
OB Discharge Summary     Patient Name: Patricia Wise DOB: 07-04-1981 MRN: [id number]  Date of admission: 08/14/2015 Delivering MD: Donnamae Jude   Date of discharge: 08/16/2015  Admitting diagnosis: cpt [phone] - REPEAT c section Intrauterine pregnancy: [redacted]w[redacted]d     Secondary diagnosis:  Principal Problem:   History of C-section Active Problems:   Pregnancy  Additional problems: none     Discharge diagnosis: Term Pregnancy Delivered                                                                                                Post partum procedures:none  Augmentation: N/A  Complications: None  Hospital course:  Sceduled C/S   34 y.o. yo G2P1102 at [redacted]w[redacted]d was admitted to the hospital 08/14/2015 for scheduled cesarean section with the following indication:Elective Repeat.  Membrane Rupture Time/Date: 12:51 PM ,08/14/2015   Patient delivered a Viable infant.08/14/2015  Details of operation can be found in separate operative note.  Pateint had an uncomplicated postpartum course.  She is ambulating, tolerating a regular diet, passing flatus, and urinating well. Patient is discharged home in stable condition on 08/16/15.          Physical exam  Filed Vitals:   08/15/15 0420 08/15/15 0854 08/15/15 1825 08/16/15 0527  BP: 104/52 97/70 110/75 128/67  Pulse: 59 61 82 102  Temp: 98 F (36.7 C) 98.2 F (36.8 C) 98.2 F (36.8 C) 98.5 F (36.9 C)  TempSrc: Oral  Oral Oral  Resp: 16 16 18 18   SpO2: 99%      General: alert and cooperative Lochia: appropriate Uterine Fundus: firm Incision: Dressing is clean, dry, and intact; sm excoriated areas on right hip due to previous tape removal  DVT Evaluation: No evidence of DVT seen on physical exam. Labs: Lab Results  Component Value Date   WBC 12.4* 08/15/2015   HGB 11.0* 08/15/2015   HCT 33.2* 08/15/2015   MCV 89.7 08/15/2015   PLT 204 08/15/2015   No flowsheet data found.  Discharge instruction: per After Visit Summary  and "Baby and Me Booklet".  After visit meds:    Medication List    STOP taking these medications        acetaminophen 500 MG tablet  Commonly known as:  TYLENOL      TAKE these medications        flintstones complete 60 MG chewable tablet  Chew 2 tablets by mouth daily.     HYDROcodone-acetaminophen 5-325 MG tablet  Commonly known as:  NORCO/VICODIN  Take 1 tablet by mouth every 4 (four) hours as needed for moderate pain.     ibuprofen 600 MG tablet  Commonly known as:  ADVIL,MOTRIN  Take 1 tablet (600 mg total) by mouth every 6 (six) hours.     simethicone 80 MG chewable tablet  Commonly known as:  MYLICON  Chew 0000000 mg by mouth every 6 (six) hours as needed for flatulence.        Diet: routine diet  Activity: Advance as tolerated. Pelvic rest for 6 weeks.   Outpatient follow up:6 weeks Follow  up Appt:Future Appointments Date Time Provider [place]  09/24/2015 12:45 PM Mora Bellman, MD [facility]   Follow up Visit:No Follow-up on file.  Postpartum contraception: considering Mirena  Newborn Data: Live born female  Birth Weight: 7 lb 10.1 oz (3460 g) APGAR: 9, 9  Baby Feeding: Bottle Disposition:home with mother   Serita Grammes, CNM  08/16/2015

## 2015-08-16 NOTE — Discharge Instructions (Signed)

## 2015-08-19 ENCOUNTER — Telehealth: Payer: Self-pay | Admitting: *Deleted

## 2015-08-19 NOTE — Telephone Encounter (Signed)
Pt left message stating that she is calling to see if she can remove the bandage from her C/S which was on 11/10.

## 2015-08-20 NOTE — Telephone Encounter (Signed)
Called patient back and she stated that a nurse came to her house yesterday and removed bandage. Patient had no further questions.

## 2015-08-22 DIAGNOSIS — O0993 Supervision of high risk pregnancy, unspecified, third trimester: Secondary | ICD-10-CM | POA: Diagnosis not present

## 2015-08-25 ENCOUNTER — Inpatient Hospital Stay (HOSPITAL_COMMUNITY)
Admission: AD | Admit: 2015-08-25 | Discharge: 2015-08-25 | Disposition: A | Discharge status: Home / Self Care | Payer: Medicaid Other | Source: Ambulatory Visit | Attending: Family Medicine | Admitting: Family Medicine

## 2015-08-25 ENCOUNTER — Encounter (HOSPITAL_COMMUNITY): Payer: Self-pay | Admitting: *Deleted

## 2015-08-25 DIAGNOSIS — K644 Residual hemorrhoidal skin tags: Secondary | ICD-10-CM

## 2015-08-25 DIAGNOSIS — G8918 Other acute postprocedural pain: Secondary | ICD-10-CM | POA: Diagnosis not present

## 2015-08-25 DIAGNOSIS — Z87891 Personal history of nicotine dependence: Secondary | ICD-10-CM | POA: Diagnosis not present

## 2015-08-25 DIAGNOSIS — R109 Unspecified abdominal pain: Secondary | ICD-10-CM | POA: Insufficient documentation

## 2015-08-25 DIAGNOSIS — O9089 Other complications of the puerperium, not elsewhere classified: Secondary | ICD-10-CM | POA: Diagnosis not present

## 2015-08-25 DIAGNOSIS — O872 Hemorrhoids in the puerperium: Secondary | ICD-10-CM | POA: Insufficient documentation

## 2015-08-25 LAB — URINALYSIS, ROUTINE W REFLEX MICROSCOPIC
BILIRUBIN URINE: NEGATIVE
Glucose, UA: NEGATIVE mg/dL
Ketones, ur: NEGATIVE mg/dL
NITRITE: NEGATIVE
Protein, ur: NEGATIVE mg/dL
SPECIFIC GRAVITY, URINE: 1.01 (ref 1.005–1.030)
pH: 7.5 (ref 5.0–8.0)

## 2015-08-25 LAB — URINE MICROSCOPIC-ADD ON: Bacteria, UA: NONE SEEN

## 2015-08-25 MED ORDER — LIDOCAINE HCL 2 % EX GEL: 1.0000 "application " | CUTANEOUS | Status: DC | PRN

## 2015-08-25 MED ORDER — HYDROCODONE-ACETAMINOPHEN 5-325 MG PO TABS: 1.0000 | ORAL_TABLET | ORAL | Status: DC | PRN

## 2015-08-25 MED ORDER — DOCUSATE SODIUM 100 MG PO CAPS: 100.0000 mg | ORAL_CAPSULE | Freq: Two times a day (BID) | ORAL | Status: DC

## 2015-08-25 NOTE — MAU Note (Addendum)
R c/s on 11/10.   Having so much pain in stomach. Hurts to eat and drink. After she pees there is pressure/pain, feels pressure all day and with all activity.  Taking stool softeners and Gas-x; hemorrhoids are acting up

## 2015-08-25 NOTE — Discharge Instructions (Signed)

## 2015-08-25 NOTE — MAU Provider Note (Signed)
Chief Complaint: Abdominal Pain   First Provider Initiated Contact with Patient 08/25/15 1039      SUBJECTIVE HPI: Patricia Wise is a 34 y.o. G2P1102 s/p RLTCS 11 days ago who presents to maternity admissions reporting abdominal pain, vaginal bleeding, and hemorrhoids. She reports more pain after this C/S than after her first surgery. Taking 2 hydrocodone tablets that were prescribed at her hospital discharge does help but she is out of the prescription.  She has tried warm compresses, rest, and tylenol for pain and these do not help.  She cannot take NSAIDs r/t hx of gastric bypass and GI concerns.  She reports light/moderate lochia, lighter than the first days after her delivery but remaining dark red and like a light period.  It does not have a foul odor.  She denies n/v, fever/chills, h/a.  She does report worsening hemorrhoids with intermittent constipation. She has tried Tucks pads and hemorrhoid cream and these help but do not remove the discomfort.   She denies vaginal itching/burning, urinary symptoms or dizziness.   HPI  Past Medical History  Diagnosis Date  . Vaginal Pap smear, abnormal   . mrsa   . Preterm labor     07/2007   Past Surgical History  Procedure Laterality Date  . Cesarean section    . Gastric bypass    . Breast surgery      augmentation  . Tonsillectomy    . Wrist surgery Bilateral     carpal tunnel  . Cesarean section N/A 08/14/2015    Procedure: REPEAT CESAREAN SECTION;  Surgeon: Donnamae Jude, MD;  Location: [facility];  Service: Obstetrics;  Laterality: N/A;  Requested 08/14/15 @ 12:30p   Social History   Social History  . Marital Status: Single    Spouse Name: N/A  . Number of Children: N/A  . Years of Education: N/A   Occupational History  . Not on file.   Social History Main Topics  . Smoking status: Former Smoker    Quit date: 08/04/2012  . Smokeless tobacco: Never Used  . Alcohol Use: No  . Drug Use: No  . Sexual Activity: Yes     Birth Control/ Protection: None   Other Topics Concern  . Not on file   Social History Narrative   No current facility-administered medications on file prior to encounter.   Current Outpatient Prescriptions on File Prior to Encounter  Medication Sig Dispense Refill  . flintstones complete (FLINTSTONES) 60 MG chewable tablet Chew 2 tablets by mouth daily.    . simethicone (MYLICON) 80 MG chewable tablet Chew 160 mg by mouth every 6 (six) hours as needed for flatulence.     No Known Allergies  ROS:  Review of Systems  Constitutional: Negative for fever, chills and fatigue.  HENT: Negative for sinus pressure.   Eyes: Negative for photophobia.  Respiratory: Negative for shortness of breath.   Cardiovascular: Negative for chest pain.  Gastrointestinal: Negative for nausea, vomiting, diarrhea and constipation.  Genitourinary: Positive for pelvic pain. Negative for dysuria, frequency, flank pain, vaginal bleeding, vaginal discharge, difficulty urinating and vaginal pain.  Musculoskeletal: Negative for neck pain.  Neurological: Negative for dizziness, weakness and headaches.  Psychiatric/Behavioral: Negative.      I have reviewed patient's Past Medical Hx, Surgical Hx, Family Hx, Social Hx, medications and allergies.   Physical Exam   Patient Vitals for the past 24 hrs:  BP Temp Temp src Pulse Resp Height Weight  08/25/15 0956 - - - - -  5\' 6"  (1.676 m) 241 lb (109.317 kg)  08/25/15 0934 136/85 mmHg 98.3 F (36.8 C) Oral 77 18 - -   Constitutional: Well-developed, well-nourished female in no acute distress.  Cardiovascular: normal rate Respiratory: normal effort GI: Abd soft, non-tender. Pos BS x 4 Skin: Incision well approximated, with no edema or erythema. Steri-strips removed by CNM during evaluation. MS: Extremities nontender, no edema, normal ROM Neurologic: Alert and oriented x 4.  GU: Neg CVAT.  Pt has 1/4 of pad soaked with dark red bleeding while in MAU 1  hour.  Fundus firm, at pubic symphysis, nontender   LAB RESULTS Results for orders placed or performed during the hospital encounter of 08/25/15 (from the past 24 hour(s))  Urinalysis, Routine w reflex microscopic (not at [facility])     Status: Abnormal   Collection Time: 08/25/15  9:40 AM  Result Value Ref Range   Color, Urine YELLOW YELLOW   APPearance CLEAR CLEAR   Specific Gravity, Urine 1.010 1.005 - 1.030   pH 7.5 5.0 - 8.0   Glucose, UA NEGATIVE NEGATIVE mg/dL   Hgb urine dipstick SMALL (A) NEGATIVE   Bilirubin Urine NEGATIVE NEGATIVE   Ketones, ur NEGATIVE NEGATIVE mg/dL   Protein, ur NEGATIVE NEGATIVE mg/dL   Nitrite NEGATIVE NEGATIVE   Leukocytes, UA SMALL (A) NEGATIVE  Urine microscopic-add on     Status: Abnormal   Collection Time: 08/25/15  9:40 AM  Result Value Ref Range   Squamous Epithelial / LPF 0-5 (A) NONE SEEN   WBC, UA 6-30 0 - 5 WBC/hpf   RBC / HPF 0-5 0 - 5 RBC/hpf   Bacteria, UA NONE SEEN NONE SEEN    --/--/O POS, O POS (11/08 1110)  IMAGING No results found.  MAU Management/MDM: Ordered labs and reviewed results.  Urine sent for culture but no immediate evidence of UTI.  No evidence of endometritis.  Will manage pain with new Rx for hydrocodone, provide sitz bath supplies for pt while in MAU today, and set up pt with follow up appointment in [facility] next week.  She will keep her 6 week follow up postpartum appointment.  Rx for lidocaine jelly for hemorrhoid pain. Pt stable at time of discharge.  ASSESSMENT Pain following cesarean delivery  External hemorrhoid   PLAN Discharge home Rx for Vicodin 5/325, take 1-2 Q 4 hours PRN Lidocaine 2% jelly for topical use on hemorrhoids Message sent for f/u in [place] in 1 week Return to MAU as needed for emergencies    Medication List    ASK your doctor about these medications        docusate sodium 100 MG capsule  Commonly known as:  COLACE  Take 100 mg by mouth daily as needed for mild constipation.      flintstones complete 60 MG chewable tablet  Chew 2 tablets by mouth daily.     simethicone 80 MG chewable tablet  Commonly known as:  MYLICON  Chew 0000000 mg by mouth every 6 (six) hours as needed for flatulence.         Fatima Blank Certified Nurse-Midwife 08/25/2015  11:13 AM

## 2015-08-26 LAB — URINE CULTURE

## 2015-09-01 ENCOUNTER — Other Ambulatory Visit: Payer: Self-pay | Admitting: Advanced Practice Midwife

## 2015-09-01 DIAGNOSIS — G8918 Other acute postprocedural pain: Secondary | ICD-10-CM

## 2015-09-01 MED ORDER — HYDROCODONE-ACETAMINOPHEN 5-325 MG PO TABS: 1.0000 | ORAL_TABLET | Freq: Four times a day (QID) | ORAL | Status: DC | PRN

## 2015-09-01 NOTE — Telephone Encounter (Signed)
Called patient and informed her of Rx available to come by and pick prescription up. Discussed with patient that there will be no other refills prior to her visit on Thursday as she should not be in that significant amount of pain this far from surgery and that if she is, she needs evaluation. Patient verbalized understanding and had no questions

## 2015-09-04 ENCOUNTER — Ambulatory Visit (INDEPENDENT_AMBULATORY_CARE_PROVIDER_SITE_OTHER): Payer: Medicaid Other | Admitting: Obstetrics & Gynecology

## 2015-09-04 ENCOUNTER — Encounter: Payer: Self-pay | Admitting: Obstetrics & Gynecology

## 2015-09-04 VITALS — BP 130/82 | HR 100 | Ht 66.0 in | Wt 237.3 lb

## 2015-09-04 DIAGNOSIS — G8918 Other acute postprocedural pain: Secondary | ICD-10-CM

## 2015-09-04 MED ORDER — HYDROCODONE-ACETAMINOPHEN 5-325 MG PO TABS
1.0000 | ORAL_TABLET | Freq: Four times a day (QID) | ORAL | Status: DC | PRN
Start: 2015-09-04 — End: 2015-09-04

## 2015-09-04 MED ORDER — CYCLOBENZAPRINE HCL 5 MG PO TABS: 5.0000 mg | ORAL_TABLET | Freq: Three times a day (TID) | ORAL | Status: DC | PRN

## 2015-09-04 MED ORDER — HYDROCODONE-ACETAMINOPHEN 5-325 MG PO TABS: 1.0000 | ORAL_TABLET | Freq: Four times a day (QID) | ORAL | Status: DC | PRN

## 2015-09-04 NOTE — Patient Instructions (Signed)

## 2015-09-04 NOTE — Progress Notes (Signed)
Subjective:back pain     Patricia Wise is a 34 y.o. female who presents to the clinic 3 weeks status post repeat cesarean section  Eating a regular diet without difficulty. Bowel movements are normal. Pain is controlled with current analgesics. Medications being used: narcotic analgesics including vicodin. Mid back pain The following portions of the patient's history were reviewed and updated as appropriate: allergies, current medications, past family history, past medical history, past social history, past surgical history and problem list.  Review of Systems Pertinent items are noted in HPI.    Objective:    BP 130/82 mmHg  Pulse 100  Ht 5\' 6"  (1.676 m)  Wt 237 lb 4.8 oz (107.639 kg)  BMI 38.32 kg/m2 General:  alert, cooperative and no distress  Abdomen: soft, bowel sounds active, non-tender  Incision:   healing well, no drainage, no erythema, no hernia, no seroma, no swelling, no dehiscence, incision well approximated    upper lumbar pain no tenderness Assessment:    Postoperative course complicated by back pain, MS Operative findings again reviewed. Pathology report discussed.    Plan:    1. Continue any current medications. 2. Wound care discussed. 3. Activity restrictions: none 4. Anticipated return to work: not applicable. 5. Follow up: 3 weeks  Rx vicodin and flexeril  Woodroe Mode, MD 09/04/2015

## 2015-09-11 ENCOUNTER — Telehealth: Payer: Self-pay

## 2015-09-11 DIAGNOSIS — B372 Candidiasis of skin and nail: Secondary | ICD-10-CM

## 2015-09-11 NOTE — Telephone Encounter (Signed)
Pt called requesting an Rx for nystatin powder because she has itching, redness, and a odor from c-section incision.

## 2015-09-12 MED ORDER — NYSTATIN 100000 UNIT/GM EX POWD: CUTANEOUS | Status: DC | PRN

## 2015-09-12 NOTE — Telephone Encounter (Signed)
Called patient and she states that under her folds she can get yeast infections there and sweat. Patient states she noticed there is a red rash there now that itches and would like Rx for nystatin powder. Per Dr Hulan Fray, may prescribe for patient. Informed patient that we would send Rx to her pharmacy. Patient verbalized understanding & had no other questions

## 2015-09-23 ENCOUNTER — Ambulatory Visit (INDEPENDENT_AMBULATORY_CARE_PROVIDER_SITE_OTHER): Payer: Medicaid Other | Admitting: Student

## 2015-09-23 ENCOUNTER — Encounter: Payer: Self-pay | Admitting: Student

## 2015-09-23 DIAGNOSIS — N760 Acute vaginitis: Secondary | ICD-10-CM

## 2015-09-23 DIAGNOSIS — M545 Low back pain: Secondary | ICD-10-CM

## 2015-09-23 DIAGNOSIS — A499 Bacterial infection, unspecified: Secondary | ICD-10-CM

## 2015-09-23 DIAGNOSIS — B9689 Other specified bacterial agents as the cause of diseases classified elsewhere: Secondary | ICD-10-CM

## 2015-09-23 MED ORDER — HYDROCODONE-ACETAMINOPHEN 5-325 MG PO TABS: 1.0000 | ORAL_TABLET | Freq: Four times a day (QID) | ORAL | Status: DC | PRN

## 2015-09-23 MED ORDER — METRONIDAZOLE 0.75 % VA GEL: 1.0000 | Freq: Two times a day (BID) | VAGINAL | Status: DC

## 2015-09-23 MED ORDER — METRONIDAZOLE 500 MG PO TABS: 500.0000 mg | ORAL_TABLET | Freq: Two times a day (BID) | ORAL | Status: DC

## 2015-09-23 NOTE — Patient Instructions (Signed)

## 2015-09-23 NOTE — Progress Notes (Signed)
Subjective:     Patricia Wise is a 34 y.o. female who presents for a postpartum visit. She is 6 weeks postpartum following a low cervical transverse Cesarean section. I have fully reviewed the prenatal and intrapartum course. The delivery was at 39w 2d gestational weeks. Outcome: repeat cesarean section, low transverse incision. Anesthesia: spinal. Postpartum course has been complicated by low back pain. Baby's course has been uneventdul. Baby is feeding by bottle. Bleeding no bleeding. Bowel function is normal. Bladder function is normal. Patient is sexually active. Contraception method is none. Postpartum depression screening: negative.  The following portions of the patient's history were reviewed and updated as appropriate: allergies, current medications, past family history, past medical history, past social history, past surgical history and problem list.  Review of Systems Constitutional: negative Gastrointestinal: negative Genitourinary:negative Integument/breast: negative Musculoskeletal:positive for back pain   Objective:    BP 111/72 mmHg  Pulse 93  Wt 241 lb 8 oz (109.544 kg)  General:  alert, cooperative and appears stated age  Lungs: clear to auscultation bilaterally  Heart:  regular rate and rhythm, S1, S2 normal, no murmur, click, rub or gallop  Abdomen: soft, non-tender; bowel sounds normal; no masses,  no organomegaly and incision well healed; no warmth, erythema, edema, or tenderness.         Assessment:     Normal postpartum exam. Pap smear not done at today's visit. Needs colpo. Will schedule with MD  Plan:  1. Postpartum care and examination   2. Low back pain without sciatica, unspecified back pain laterality -Small rx of norco. F/u with PCP.  3. BV (bacterial vaginosis) -pt previously diagnosed & declined tx at that time - symptoms have remained & is now requesting treatment -rx metrogel   Declines contraception. Discussed importance of waiting >1  year for subsequent pregnancy.  Follow up in:  To scheduled colposcopy  Jorje Guild, NP

## 2015-09-24 ENCOUNTER — Ambulatory Visit: Payer: Medicaid Other | Admitting: Obstetrics and Gynecology

## 2015-09-26 ENCOUNTER — Ambulatory Visit: Payer: Medicaid Other | Admitting: Obstetrics & Gynecology

## 2015-10-22 ENCOUNTER — Encounter: Payer: Medicaid Other | Admitting: Obstetrics and Gynecology

## 2015-11-07 ENCOUNTER — Ambulatory Visit (INDEPENDENT_AMBULATORY_CARE_PROVIDER_SITE_OTHER): Payer: Medicaid Other | Admitting: Family Medicine

## 2015-11-07 ENCOUNTER — Other Ambulatory Visit (HOSPITAL_COMMUNITY)
Admission: RE | Admit: 2015-11-07 | Discharge: 2015-11-07 | Disposition: A | Discharge status: Home / Self Care | Payer: Medicaid Other | Source: Ambulatory Visit | Attending: Family Medicine | Admitting: Family Medicine

## 2015-11-07 ENCOUNTER — Encounter: Payer: Self-pay | Admitting: Family Medicine

## 2015-11-07 VITALS — BP 109/74 | HR 87 | Temp 98.5°F | Resp 87 | Wt 236.5 lb

## 2015-11-07 DIAGNOSIS — Z1151 Encounter for screening for human papillomavirus (HPV): Secondary | ICD-10-CM | POA: Insufficient documentation

## 2015-11-07 DIAGNOSIS — Z01419 Encounter for gynecological examination (general) (routine) without abnormal findings: Secondary | ICD-10-CM | POA: Insufficient documentation

## 2015-11-07 DIAGNOSIS — Z124 Encounter for screening for malignant neoplasm of cervix: Secondary | ICD-10-CM | POA: Diagnosis present

## 2015-11-07 DIAGNOSIS — Z3202 Encounter for pregnancy test, result negative: Secondary | ICD-10-CM

## 2015-11-07 DIAGNOSIS — R87619 Unspecified abnormal cytological findings in specimens from cervix uteri: Secondary | ICD-10-CM

## 2015-11-07 LAB — POCT PREGNANCY, URINE: PREG TEST UR: NEGATIVE

## 2015-11-07 NOTE — Progress Notes (Signed)
   Subjective:    Patient ID: Patricia Wise, female    DOB: [birth year], 35 y.o.   MRN: [id number]  HPI This is 35 year old with a history of abnormal Pap smears who presents to the clinic for colposcopy. Her last Pap smear was in December of 2015 which showed ASCUS with HPV negative, although possibly had high-grade cells. The Pap smears report is not available to me. She reports no symptoms or complications.   Review of Systems     Objective:   Physical Exam  Constitutional: She appears well-developed and well-nourished.  Genitourinary: There is no rash, tenderness, lesion or injury on the right labia. There is no rash, tenderness, lesion or injury on the left labia. Cervix exhibits no discharge and no friability. No erythema, tenderness or bleeding in the vagina. No foreign body around the vagina. No signs of injury around the vagina. No vaginal discharge found.      Assessment & Plan:  1. Screening for cervical cancer - Cytology - PAP 2. Abnormal cervical Papanicolaou smear, unspecified abnormal pap finding

## 2015-11-10 LAB — CYTOLOGY - PAP

## 2017-01-10 ENCOUNTER — Ambulatory Visit (INDEPENDENT_AMBULATORY_CARE_PROVIDER_SITE_OTHER): Payer: Medicaid Other

## 2017-01-10 ENCOUNTER — Encounter: Payer: Self-pay | Admitting: Obstetrics & Gynecology

## 2017-01-10 DIAGNOSIS — Z3201 Encounter for pregnancy test, result positive: Secondary | ICD-10-CM

## 2017-01-10 LAB — POCT PREGNANCY, URINE: PREG TEST UR: POSITIVE — AB

## 2017-01-10 NOTE — Progress Notes (Signed)
Patient presented to office today for pregnancy test. Patient ist currently pregnant around 5 weeks according to her last appointment. Patient has been advised to start prenatal vitamins at this time and to always call before taking any new medications that are not safe in pregnancy. Patient has been given a letter of verification to apply to apply for wic and medicaid.

## 2017-02-01 DIAGNOSIS — D649 Anemia, unspecified: Secondary | ICD-10-CM

## 2017-02-01 HISTORY — DX: Anemia, unspecified: D64.9

## 2017-02-14 ENCOUNTER — Telehealth: Payer: Self-pay | Admitting: *Deleted

## 2017-02-14 NOTE — Telephone Encounter (Signed)
Pt left message on 5/10 requesting dental letter to be faxed to her dentist.  She did not state the fax number.  Per chart review, pt had +UPT on 01/10/17 and was approximately 5w EGA.  She has new Ob appt scheduled on 02/15/17.  I called pt back and informed her that we can provide the letter as requested and asked for the fax number of her dentist.  Pt stated that she has office appt tomorrow and will obtain the letter then.

## 2017-02-15 ENCOUNTER — Encounter: Payer: Self-pay | Admitting: Advanced Practice Midwife

## 2017-02-15 ENCOUNTER — Telehealth: Payer: Self-pay | Admitting: Advanced Practice Midwife

## 2017-02-15 ENCOUNTER — Ambulatory Visit: Payer: Self-pay

## 2017-02-15 ENCOUNTER — Other Ambulatory Visit: Payer: Self-pay | Admitting: Advanced Practice Midwife

## 2017-02-15 ENCOUNTER — Ambulatory Visit (HOSPITAL_COMMUNITY)
Admission: RE | Admit: 2017-02-15 | Discharge: 2017-02-15 | Disposition: A | Discharge status: Home / Self Care | Payer: Medicaid Other | Source: Ambulatory Visit | Attending: Advanced Practice Midwife | Admitting: Advanced Practice Midwife

## 2017-02-15 ENCOUNTER — Ambulatory Visit (INDEPENDENT_AMBULATORY_CARE_PROVIDER_SITE_OTHER): Payer: Medicaid Other | Admitting: Advanced Practice Midwife

## 2017-02-15 VITALS — BP 107/68 | HR 84 | Wt 214.9 lb

## 2017-02-15 DIAGNOSIS — O0991 Supervision of high risk pregnancy, unspecified, first trimester: Secondary | ICD-10-CM

## 2017-02-15 DIAGNOSIS — Z3480 Encounter for supervision of other normal pregnancy, unspecified trimester: Secondary | ICD-10-CM

## 2017-02-15 DIAGNOSIS — O0990 Supervision of high risk pregnancy, unspecified, unspecified trimester: Secondary | ICD-10-CM

## 2017-02-15 DIAGNOSIS — O26841 Uterine size-date discrepancy, first trimester: Secondary | ICD-10-CM | POA: Diagnosis not present

## 2017-02-15 DIAGNOSIS — Z3481 Encounter for supervision of other normal pregnancy, first trimester: Secondary | ICD-10-CM | POA: Diagnosis not present

## 2017-02-15 DIAGNOSIS — O208 Other hemorrhage in early pregnancy: Secondary | ICD-10-CM | POA: Diagnosis not present

## 2017-02-15 DIAGNOSIS — O3680X0 Pregnancy with inconclusive fetal viability, not applicable or unspecified: Secondary | ICD-10-CM | POA: Insufficient documentation

## 2017-02-15 DIAGNOSIS — Z3A10 10 weeks gestation of pregnancy: Secondary | ICD-10-CM | POA: Insufficient documentation

## 2017-02-15 DIAGNOSIS — O021 Missed abortion: Secondary | ICD-10-CM | POA: Insufficient documentation

## 2017-02-15 LAB — POCT URINALYSIS DIP (DEVICE)
Bilirubin Urine: NEGATIVE
GLUCOSE, UA: NEGATIVE mg/dL
Hgb urine dipstick: NEGATIVE
Ketones, ur: NEGATIVE mg/dL
Leukocytes, UA: NEGATIVE
NITRITE: NEGATIVE
PH: 7 (ref 5.0–8.0)
PROTEIN: NEGATIVE mg/dL
Specific Gravity, Urine: 1.02 (ref 1.005–1.030)
UROBILINOGEN UA: 0.2 mg/dL (ref 0.0–1.0)

## 2017-02-15 NOTE — Progress Notes (Signed)
Pt informed that the ultrasound is considered a limited OB ultrasound and is not intended to be a complete ultrasound exam.  Patient also informed that the ultrasound is not being completed with the intent of assessing for fetal or placental anomalies or any pelvic abnormalities.  Explained that the purpose of today's ultrasound is to assess for viabiity.  Patient acknowledges the purpose of the exam and the limitations of the study.    Unable to accurately document fetal viability with abdominal probe.  Pt will require transvaginal scan - scheduled for today.

## 2017-02-15 NOTE — Progress Notes (Signed)
   Subjective:    Patient ID: Patricia Wise, female    DOB: 1980-11-09, 36 y.o.   MRN: [id number]  HPI: Here for NOB visit. Has not had any US's this pregnancy. 03.4 by certain LMP. Denies abd pain, VB.    Review of Systems  Constitutional: Positive for fatigue. Negative for chills and fever.  Gastrointestinal: Negative for abdominal pain, nausea and vomiting.  Genitourinary: Negative for vaginal bleeding.  .      Objective:   Physical Exam  Constitutional: She is oriented to person, place, and time. She appears well-developed and well-nourished. No distress.  Eyes: No scleral icterus.  Cardiovascular: Normal rate.   Pulmonary/Chest: Effort normal. No respiratory distress.  Abdominal: There is no tenderness.  Neurological: She is alert and oriented to person, place, and time.  Skin: Skin is warm and dry.  Psychiatric: She has a normal mood and affect.  Nursing note and vitals reviewed.  Informal US shows GS, but no clear FP. See note    Assessment & Plan:  Pregnancy of Uncertain viability   - Will send for formal TV US.  Patricia Wise, [place], [place] 02/15/2017 11:40

## 2017-02-15 NOTE — Telephone Encounter (Signed)
Called pt with results of US showing 10 week anembryonic pregnancy. Discussed options for management of missed AB including expectant management, Cytotec or D&C. Prefers D&C. In-basket massage sent to [facility] to schedule. Pt instructed to call [facility] or go to MAU if heavy bleeding, severe abd pain or fever occur. Pty verbalizes understanding. Support given. [facility].

## 2017-02-17 ENCOUNTER — Encounter (HOSPITAL_COMMUNITY): Payer: Self-pay | Admitting: *Deleted

## 2017-02-17 ENCOUNTER — Telehealth: Payer: Self-pay | Admitting: *Deleted

## 2017-02-17 NOTE — Telephone Encounter (Signed)
Patient will be contacted today concerning D&C

## 2017-02-17 NOTE — Telephone Encounter (Signed)
Received a voicemessage from this am. Patricia Wise states she was seen Tuesday and told she had a miscarriage and would be scheduled for a D&C, but no one has called her. Would like to be called with the plan of care.

## 2017-02-18 ENCOUNTER — Ambulatory Visit (HOSPITAL_COMMUNITY)
Admission: RE | Admit: 2017-02-18 | Discharge: 2017-02-18 | Disposition: A | Discharge status: Home / Self Care | Payer: Medicaid Other | Source: Ambulatory Visit | Attending: Obstetrics & Gynecology | Admitting: Obstetrics & Gynecology

## 2017-02-18 ENCOUNTER — Encounter (HOSPITAL_COMMUNITY): Payer: Self-pay | Admitting: *Deleted

## 2017-02-18 ENCOUNTER — Ambulatory Visit (HOSPITAL_COMMUNITY): Payer: Medicaid Other | Admitting: Anesthesiology

## 2017-02-18 ENCOUNTER — Encounter (HOSPITAL_COMMUNITY)
Admission: RE | Disposition: A | Discharge status: Home / Self Care | Payer: Self-pay | Source: Ambulatory Visit | Attending: Obstetrics & Gynecology

## 2017-02-18 DIAGNOSIS — Z9884 Bariatric surgery status: Secondary | ICD-10-CM | POA: Diagnosis not present

## 2017-02-18 DIAGNOSIS — Z87891 Personal history of nicotine dependence: Secondary | ICD-10-CM | POA: Diagnosis not present

## 2017-02-18 DIAGNOSIS — O021 Missed abortion: Secondary | ICD-10-CM | POA: Diagnosis present

## 2017-02-18 HISTORY — PX: DILATION AND EVACUATION: SHX1459

## 2017-02-18 LAB — CBC
HCT: 39.1 % (ref 36.0–46.0)
HEMOGLOBIN: 13 g/dL (ref 12.0–15.0)
MCH: 28 pg (ref 26.0–34.0)
MCHC: 33.2 g/dL (ref 30.0–36.0)
MCV: 84.1 fL (ref 78.0–100.0)
Platelets: 285 10*3/uL (ref 150–400)
RBC: 4.65 MIL/uL (ref 3.87–5.11)
RDW: 14.7 % (ref 11.5–15.5)
WBC: 8.9 10*3/uL (ref 4.0–10.5)

## 2017-02-18 SURGERY — DILATION AND EVACUATION, UTERUS
Anesthesia: General

## 2017-02-18 MED ORDER — MEPERIDINE HCL 25 MG/ML IJ SOLN: 6.2500 mg | INTRAMUSCULAR | Status: DC | PRN

## 2017-02-18 MED ORDER — HYDROCODONE-ACETAMINOPHEN 5-325 MG PO TABS: 1.0000 | ORAL_TABLET | Freq: Four times a day (QID) | ORAL | 0 refills | Status: DC | PRN

## 2017-02-18 MED ORDER — LACTATED RINGERS IV SOLN: INTRAVENOUS | Status: DC

## 2017-02-18 MED ORDER — ONDANSETRON HCL 4 MG/2ML IJ SOLN
INTRAMUSCULAR | Status: DC | PRN
  Administered 2017-02-18: 4 mg via INTRAVENOUS

## 2017-02-18 MED ORDER — LIDOCAINE HCL (CARDIAC) 20 MG/ML IV SOLN
INTRAVENOUS | Status: DC | PRN
  Administered 2017-02-18: 80 mg via INTRAVENOUS

## 2017-02-18 MED ORDER — FENTANYL CITRATE (PF) 100 MCG/2ML IJ SOLN
INTRAMUSCULAR | Status: DC | PRN
  Administered 2017-02-18: 100 ug via INTRAVENOUS

## 2017-02-18 MED ORDER — FENTANYL CITRATE (PF) 100 MCG/2ML IJ SOLN
INTRAMUSCULAR | Status: AC
  Filled 2017-02-18: qty 2

## 2017-02-18 MED ORDER — SCOPOLAMINE 1 MG/3DAYS TD PT72
1.0000 | MEDICATED_PATCH | Freq: Once | TRANSDERMAL | Status: DC
  Administered 2017-02-18: 1.5 mg via TRANSDERMAL

## 2017-02-18 MED ORDER — LACTATED RINGERS IV SOLN
INTRAVENOUS | Status: DC
  Administered 2017-02-18 (×2): via INTRAVENOUS

## 2017-02-18 MED ORDER — MIDAZOLAM HCL 2 MG/2ML IJ SOLN
INTRAMUSCULAR | Status: AC
  Filled 2017-02-18: qty 2

## 2017-02-18 MED ORDER — KETOROLAC TROMETHAMINE 30 MG/ML IJ SOLN
INTRAMUSCULAR | Status: DC | PRN
  Administered 2017-02-18: 30 mg via INTRAVENOUS

## 2017-02-18 MED ORDER — DEXAMETHASONE SODIUM PHOSPHATE 10 MG/ML IJ SOLN
INTRAMUSCULAR | Status: DC | PRN
  Administered 2017-02-18: 10 mg via INTRAVENOUS

## 2017-02-18 MED ORDER — PROPOFOL 10 MG/ML IV BOLUS
INTRAVENOUS | Status: DC | PRN
  Administered 2017-02-18: 200 mg via INTRAVENOUS

## 2017-02-18 MED ORDER — FENTANYL CITRATE (PF) 100 MCG/2ML IJ SOLN: 25.0000 ug | INTRAMUSCULAR | Status: DC | PRN

## 2017-02-18 MED ORDER — SCOPOLAMINE 1 MG/3DAYS TD PT72
MEDICATED_PATCH | TRANSDERMAL | Status: AC
  Administered 2017-02-18: 1.5 mg via TRANSDERMAL
  Filled 2017-02-18: qty 1

## 2017-02-18 MED ORDER — DOXYCYCLINE HYCLATE 100 MG IV SOLR
200.0000 mg | INTRAVENOUS | Status: AC
  Administered 2017-02-18: 200 mg via INTRAVENOUS
  Filled 2017-02-18: qty 200

## 2017-02-18 MED ORDER — BUPIVACAINE HCL 0.5 % IJ SOLN
INTRAMUSCULAR | Status: DC | PRN
  Administered 2017-02-18: 30 mL

## 2017-02-18 MED ORDER — METOCLOPRAMIDE HCL 5 MG/ML IJ SOLN: 10.0000 mg | Freq: Once | INTRAMUSCULAR | Status: DC | PRN

## 2017-02-18 MED ORDER — BUPIVACAINE HCL (PF) 0.5 % IJ SOLN
INTRAMUSCULAR | Status: AC
  Filled 2017-02-18: qty 30

## 2017-02-18 MED ORDER — MIDAZOLAM HCL 2 MG/2ML IJ SOLN
INTRAMUSCULAR | Status: DC | PRN
  Administered 2017-02-18: 2 mg via INTRAVENOUS

## 2017-02-18 SURGICAL SUPPLY — 19 items
CATH ROBINSON RED A/P 16FR (CATHETERS) ×2 IMPLANT
CLOTH BEACON ORANGE TIMEOUT ST (SAFETY) ×2 IMPLANT
DECANTER SPIKE VIAL GLASS SM (MISCELLANEOUS) ×2 IMPLANT
GLOVE BIOGEL PI IND STRL 7.0 (GLOVE) ×3 IMPLANT
GLOVE BIOGEL PI INDICATOR 7.0 (GLOVE) ×3
GLOVE ECLIPSE 7.0 STRL STRAW (GLOVE) ×2 IMPLANT
GOWN STRL REUS W/TWL LRG LVL3 (GOWN DISPOSABLE) ×4 IMPLANT
KIT BERKELEY 1ST TRIMESTER 3/8 (MISCELLANEOUS) ×2 IMPLANT
NS IRRIG 1000ML POUR BTL (IV SOLUTION) ×2 IMPLANT
PACK VAGINAL MINOR WOMEN LF (CUSTOM PROCEDURE TRAY) ×2 IMPLANT
PAD OB MATERNITY 4.3X12.25 (PERSONAL CARE ITEMS) ×2 IMPLANT
PAD PREP 24X48 CUFFED NSTRL (MISCELLANEOUS) ×2 IMPLANT
SET BERKELEY SUCTION TUBING (SUCTIONS) ×2 IMPLANT
TOWEL OR 17X24 6PK STRL BLUE (TOWEL DISPOSABLE) ×4 IMPLANT
VACURETTE 10 RIGID CVD (CANNULA) ×2 IMPLANT
VACURETTE 6 ASPIR F TIP BERK (CANNULA) IMPLANT
VACURETTE 7MM CVD STRL WRAP (CANNULA) IMPLANT
VACURETTE 8 RIGID CVD (CANNULA) IMPLANT
VACURETTE 9 RIGID CVD (CANNULA) IMPLANT

## 2017-02-18 NOTE — Anesthesia Preprocedure Evaluation (Signed)
Anesthesia Evaluation  Patient identified by MRN, date of birth, ID band Patient awake    Reviewed: Allergy & Precautions, NPO status , Patient's Chart, lab work & pertinent test results  Airway Mallampati: II  TM Distance: >3 FB Neck ROM: Full    Dental no notable dental hx.    Pulmonary neg pulmonary ROS, former smoker,    Pulmonary exam normal breath sounds clear to auscultation       Cardiovascular negative cardio ROS Normal cardiovascular exam Rhythm:Regular Rate:Normal     Neuro/Psych negative neurological ROS  negative psych ROS   GI/Hepatic negative GI ROS, Neg liver ROS,   Endo/Other  negative endocrine ROS  Renal/GU negative Renal ROS  negative genitourinary   Musculoskeletal negative musculoskeletal ROS (+)   Abdominal   Peds negative pediatric ROS (+)  Hematology negative hematology ROS (+)   Anesthesia Other Findings   Reproductive/Obstetrics negative OB ROS                            Anesthesia Physical Anesthesia Plan  ASA: II  Anesthesia Plan: General   Post-op Pain Management:    Induction: Intravenous  Airway Management Planned: LMA  Additional Equipment:   Intra-op Plan:   Post-operative Plan: Extubation in OR  Informed Consent: I have reviewed the patients History and Physical, chart, labs and discussed the procedure including the risks, benefits and alternatives for the proposed anesthesia with the patient or authorized representative who has indicated his/her understanding and acceptance.   Dental advisory given  Plan Discussed with: CRNA  Anesthesia Plan Comments:         Anesthesia Quick Evaluation

## 2017-02-18 NOTE — Anesthesia Procedure Notes (Signed)
Procedure Name: LMA Insertion Date/Time: 02/18/2017 12:45 PM Performed by: Hewitt Blade Pre-anesthesia Checklist: Patient identified, Emergency Drugs available, Suction available and Patient being monitored Patient Re-evaluated:Patient Re-evaluated prior to inductionOxygen Delivery Method: Circle system utilized Preoxygenation: Pre-oxygenation with 100% oxygen Intubation Type: IV induction LMA: LMA inserted LMA Size: 4.0 Number of attempts: 1 Placement Confirmation: positive ETCO2 and breath sounds checked- equal and bilateral Tube secured with: Tape Dental Injury: Teeth and Oropharynx as per pre-operative assessment

## 2017-02-18 NOTE — Discharge Instructions (Signed)
DISCHARGE INSTRUCTIONS: D&C / D&E The following instructions have been prepared to help you care for yourself upon your return home.   Personal hygiene:  Use sanitary pads for vaginal drainage, not tampons.  Shower the day after your procedure.  NO tub baths, pools or Jacuzzis for 2-3 weeks.  Wipe front to back after using the bathroom.  Activity and limitations:  Do NOT drive or operate any equipment for 24 hours. The effects of anesthesia are still present and drowsiness may result.  Do NOT rest in bed all day.  Walking is encouraged.  Walk up and down stairs slowly.  You may resume your normal activity in one to two days or as indicated by your physician.  Sexual activity: NO intercourse for at least 2 weeks after the procedure, or as indicated by your physician.  Diet: Eat a light meal as desired this evening. You may resume your usual diet tomorrow.  Return to work: You may resume your work activities in one to two days or as indicated by your doctor.  What to expect after your surgery: Expect to have vaginal bleeding/discharge for 2-3 days and spotting for up to 10 days. It is not unusual to have soreness for up to 1-2 weeks. You may have a slight burning sensation when you urinate for the first day. Mild cramps may continue for a couple of days. You may have a regular period in 2-6 weeks.  Call your doctor for any of the following:  Excessive vaginal bleeding, saturating and changing one pad every hour.  Inability to urinate 6 hours after discharge from hospital.  Pain not relieved by pain medication.  Fever of 100.4 F or greater.  Unusual vaginal discharge or odor.   Call for an appointment:    Patients signature: ______________________  Nurses signature ________________________  Support person's signature_______________________   Dilation and Curettage or Vacuum Curettage, Care After This sheet gives you information about how to care for yourself  after your procedure. Your health care provider may also give you more specific instructions. If you have problems or questions, contact your health care provider. What can I expect after the procedure? After your procedure, it is common to have:  Mild pain or cramping.  Some vaginal bleeding or spotting. These may last for up to 2 weeks after your procedure. Follow these instructions at home: Activity    Do not drive or use heavy machinery while taking prescription pain medicine.  Avoid driving for the first 24 hours after your procedure.  Take frequent, short walks, followed by rest periods, throughout the day. Ask your health care provider what activities are safe for you. After 1-2 days, you may be able to return to your normal activities.  Do not lift anything heavier than 10 lb (4.5 kg) until your health care provider approves.  For at least 2 weeks, or as long as told by your health care provider, do not:  Douche.  Use tampons.  Have sexual intercourse. General instructions    Take over-the-counter and prescription medicines only as told by your health care provider. This is especially important if you take blood thinning medicine.  Do not take baths, swim, or use a hot tub until your health care provider approves. Take showers instead of baths.  Wear compression stockings as told by your health care provider. These stockings help to prevent blood clots and reduce swelling in your legs.  It is your responsibility to get the results of your procedure. Ask your  health care provider, or the department performing the procedure, when your results will be ready.  Keep all follow-up visits as told by your health care provider. This is important. Contact a health care provider if:  You have severe cramps that get worse or that do not get better with medicine.  You have severe abdominal pain.  You cannot drink fluids without vomiting.  You develop pain in a different area  of your pelvis.  You have bad-smelling vaginal discharge.  You have a rash. Get help right away if:  You have vaginal bleeding that soaks more than one sanitary pad in 1 hour, for 2 hours in a row.  You pass large blood clots from your vagina.  You have a fever that is above 100.34F (38.0C).  Your abdomen feels very tender or hard.  You have chest pain.  You have shortness of breath.  You cough up blood.  You feel dizzy or light-headed.  You faint.  You have pain in your neck or shoulder area. This information is not intended to replace advice given to you by your health care provider. Make sure you discuss any questions you have with your health care provider. Document Released: 09/17/2000 Document Revised: 05/19/2016 Document Reviewed: 04/22/2016 Elsevier Interactive Patient Education  2017 [employer].

## 2017-02-18 NOTE — Anesthesia Postprocedure Evaluation (Signed)
Anesthesia Post Note  Patient: Patricia Wise  Procedure(s) Performed: Procedure(s) (LRB): DILATATION AND EVACUATION (N/A)  Patient location during evaluation: PACU Anesthesia Type: General Level of consciousness: awake and alert Pain management: pain level controlled Vital Signs Assessment: post-procedure vital signs reviewed and stable Respiratory status: spontaneous breathing, nonlabored ventilation, respiratory function stable and patient connected to nasal cannula oxygen Cardiovascular status: blood pressure returned to baseline and stable Postop Assessment: no signs of nausea or vomiting Anesthetic complications: no        Last Vitals:  Vitals:   02/18/17 1409 02/18/17 1437  BP:  103/61  Pulse:  74  Resp:  20  Temp: 36.9 C     Last Pain:  Vitals:   02/18/17 1409  TempSrc: Oral   Pain Goal: Patients Stated Pain Goal: 3 (02/18/17 1122)               Montez Hageman

## 2017-02-18 NOTE — H&P (Signed)
Preoperative History and Physical  Patricia Wise is a 36 y.o. [id number] here for surgical management of first trimester missed abortion.   No significant preoperative concerns.  Proposed surgery: Dilation and Evacuation.  Past Medical History:  Diagnosis Date  . mrsa   . Preterm labor    07/2007  . Vaginal Pap smear, abnormal    Past Surgical History:  Procedure Laterality Date  . BREAST SURGERY     augmentation  . CESAREAN SECTION    . CESAREAN SECTION N/A 08/14/2015   Procedure: REPEAT CESAREAN SECTION;  Surgeon: Donnamae Jude, MD;  Location: [facility];  Service: Obstetrics;  Laterality: N/A;  Requested 08/14/15 @ 12:30p  . GASTRIC BYPASS    . TONSILLECTOMY    . WRIST SURGERY Bilateral    carpal tunnel   OB History  Gravida Para Term Preterm AB Living  3 2 1 1   2   SAB TAB Ectopic Multiple Live Births        0 2    # Outcome Date GA Lbr Len/2nd Weight Sex Delivery Anes PTL Lv  3 Current           2 Term 08/14/15 [redacted]w[redacted]d  7 lb 10.1 oz (3.46 kg) F CS-LTranv Spinal  LIV  1 Preterm 07/15/07 [redacted]w[redacted]d  6 lb 5 oz (2.863 kg) F CS-LTranv Spinal  LIV    Patient denies any other pertinent gynecologic issues.   No current facility-administered medications on file prior to encounter.    No current outpatient prescriptions on file prior to encounter.   No Known Allergies  Social History:   reports that she quit smoking about 4 years ago. She has never used smokeless tobacco. She reports that she drinks alcohol. She reports that she does not use drugs.  Family History  Problem Relation Age of Onset  . Cancer Mother     Review of Systems: Noncontributory  PHYSICAL EXAM: Height 5\' 6"  (1.676 m), weight 212 lb (96.2 kg), last menstrual period 12/03/2016, not currently breastfeeding. CONSTITUTIONAL: Well-developed, well-nourished female in no acute distress.  HENT:  Normocephalic, atraumatic, External right and left ear normal. Oropharynx is clear and moist EYES: Conjunctivae and EOM  are normal. Pupils are equal, round, and reactive to light. No scleral icterus.  NECK: Normal range of motion, supple, no masses SKIN: Skin is warm and dry. No rash noted. Not diaphoretic. No erythema. No pallor. NEUROLOGIC: Alert and oriented to person, place, and time. Normal reflexes, muscle tone coordination. No cranial nerve deficit noted. PSYCHIATRIC: Normal mood and affect. Normal behavior. Normal judgment and thought content. CARDIOVASCULAR: Normal heart rate noted, regular rhythm RESPIRATORY: Effort and breath sounds normal, no problems with respiration noted ABDOMEN: Soft, nontender, nondistended. PELVIC: Deferred MUSCULOSKELETAL: Normal range of motion. No edema and no tenderness. 2+ distal pulses.  Labs: Results for orders placed or performed in visit on 02/15/17 (from the past 336 hour(s))  POCT urinalysis dip (device)   Collection Time: 02/15/17 10:13 AM  Result Value Ref Range   Glucose, UA NEGATIVE NEGATIVE mg/dL   Bilirubin Urine NEGATIVE NEGATIVE   Ketones, ur NEGATIVE NEGATIVE mg/dL   Specific Gravity, Urine 1.020 1.005 - 1.030   Hgb urine dipstick NEGATIVE NEGATIVE   pH 7.0 5.0 - 8.0   Protein, ur NEGATIVE NEGATIVE mg/dL   Urobilinogen, UA 0.2 0.0 - 1.0 mg/dL   Nitrite NEGATIVE NEGATIVE   Leukocytes, UA NEGATIVE NEGATIVE    Imaging Studies: US Ob Comp Less 14 Wks  Result Date: 02/15/2017 CLINICAL  DATA:  Viability EXAM: OBSTETRIC <14 WK [place] AND TRANSVAGINAL OB US TECHNIQUE: Both transabdominal and transvaginal ultrasound examinations were performed for complete evaluation of the gestation as well as the maternal uterus, adnexal regions, and pelvic cul-de-sac. Transvaginal technique was performed to assess early pregnancy. COMPARISON:  None. FINDINGS: Intrauterine gestational sac: Single Yolk sac:  Visualized Embryo:  Not visualized Cardiac Activity: Not visualized Heart Rate:   bpm MSD: 45  mm   10 w   0  d CRL:    mm    w    d                  [place] EDC: Subchorionic  hemorrhage:  Small subchorionic hemorrhage Maternal uterus/adnexae: No adnexal masses or free fluid IMPRESSION: Ten week intrauterine pregnancy by mean sac diameter. Yolk sac is visualized, but no fetal pole. Findings meet definitive criteria for failed pregnancy. This follows SRU consensus guidelines: Diagnostic Criteria for Nonviable Pregnancy Early in the First Trimester. Alison Stalling J Med [phone]. Small subchorionic hemorrhage. Electronically Signed   By: Rolm Baptise M.D.   On: 02/15/2017 12:53   US Ob Transvaginal  Result Date: 02/15/2017 CLINICAL DATA:  Viability EXAM: OBSTETRIC <14 WK [place] AND TRANSVAGINAL OB US TECHNIQUE: Both transabdominal and transvaginal ultrasound examinations were performed for complete evaluation of the gestation as well as the maternal uterus, adnexal regions, and pelvic cul-de-sac. Transvaginal technique was performed to assess early pregnancy. COMPARISON:  None. FINDINGS: Intrauterine gestational sac: Single Yolk sac:  Visualized Embryo:  Not visualized Cardiac Activity: Not visualized Heart Rate:   bpm MSD: 45  mm   10 w   0  d CRL:    mm    w    d                  [place] EDC: Subchorionic hemorrhage:  Small subchorionic hemorrhage Maternal uterus/adnexae: No adnexal masses or free fluid IMPRESSION: Ten week intrauterine pregnancy by mean sac diameter. Yolk sac is visualized, but no fetal pole. Findings meet definitive criteria for failed pregnancy. This follows SRU consensus guidelines: Diagnostic Criteria for Nonviable Pregnancy Early in the First Trimester. Alison Stalling J Med [phone]. Small subchorionic hemorrhage. Electronically Signed   By: Rolm Baptise M.D.   On: 02/15/2017 12:53    Assessment: Patient Active Problem List   Diagnosis Date Noted  . Missed ab 02/15/2017  . History of C-section 06/24/2015  . Abnormal Pap smear of cervix 06/16/2015  . Chlamydia 06/16/2015  . Thyroid disease 06/16/2015  . H/O gastric bypass 06/16/2015    Plan: Patient will  undergo surgical management with Dilation and Evacuation.  Risks of surgery including bleeding, infection, injury to surrounding organs, need for additional procedures, possibility of intrauterine scarring which may impair future fertility, risk of retained products which may require further management and other postoperative/anesthesia complications were explained to patient.  Likelihood of success of complete evacuation of the uterus was discussed with the patient.  Written informed consent was obtained.  Patient has been NPO since last night  she will remain NPO for procedure. Anesthesia and OR aware.  Preoperative prophylactic Doxycycline 200mg  IV  has been ordered and is on call to the OR.  To OR when ready.    Verita Schneiders, M.D. 02/18/2017 11:13 AM

## 2017-02-18 NOTE — Op Note (Signed)
Patricia Wise PROCEDURE DATE: 02/18/2017  PREOPERATIVE DIAGNOSIS: 11 week missed abortion POSTOPERATIVE DIAGNOSIS: The same PROCEDURE:     Dilation and Evacuation SURGEON:  Dr. Verita Schneiders  INDICATIONS: 36 y.o. [id number] with MAB at [redacted] weeks gestation (only had enlarged gestational sac, no yolk sac or fetal pole), needing surgical completion.  Risks of surgery were discussed with the patient including but not limited to: bleeding which may require transfusion; infection which may require antibiotics; injury to uterus or surrounding organs; need for additional procedures including laparotomy or laparoscopy; possibility of intrauterine scarring which may impair future fertility; and other postoperative/anesthesia complications. Written informed consent was obtained.    FINDINGS:  A 11 week size uterus, moderate amounts of products of conception, specimen sent to pathology.  ANESTHESIA:    Monitored intravenous sedation, paracervical block. INTRAVENOUS FLUIDS:  1000 ml of LR ESTIMATED BLOOD LOSS:  100 ml. SPECIMENS:  Products of conception sent to pathology COMPLICATIONS:  None immediate.  PROCEDURE DETAILS:  The patient received intravenous Doxycycline while in the preoperative area.  She was then taken to the operating room where monitored intravenous sedation was administered and was found to be adequate.  After an adequate timeout was performed, she was placed in the dorsal lithotomy position and examined; then prepped and draped in the sterile manner.   Her bladder was catheterized for an unmeasured amount of clear, yellow urine. A vaginal speculum was then placed in the patient's vagina and a single tooth tenaculum was applied to the anterior lip of the cervix.  A paracervical block using 30 ml of 0.5% Marcaine was administered. The cervix was gently dilated to accommodate a 10 mm suction curette that was gently advanced to the uterine fundus.  The suction device was then activated and  curette slowly rotated to clear the uterus of products of conception.  A sharp curettage was then performed to confirm complete emptying of the uterus. There was minimal bleeding noted and the tenaculum removed with good hemostasis noted.   All instruments were removed from the patient's vagina.  Sponge and instrument counts were correct times two  The patient tolerated the procedure well and was taken to the recovery area awake, and in stable condition.  The patient will be discharged to home as per PACU criteria.  Routine postoperative instructions given.  She was prescribed Vicodin as per her preference due to her history.  She will follow up in the clinic on 03/17/17 for postoperative evaluation.   Verita Schneiders, MD, [facility] Attending [place], [facility]

## 2017-02-18 NOTE — Transfer of Care (Signed)
Immediate Anesthesia Transfer of Care Note  Patient: Patricia Wise  Procedure(s) Performed: Procedure(s): DILATATION AND EVACUATION (N/A)  Patient Location: PACU  Anesthesia Type:General  Level of Consciousness: awake, alert  and oriented  Airway & Oxygen Therapy: Patient Spontanous Breathing and Patient connected to nasal cannula oxygen  Post-op Assessment: Report given to RN, Post -op Vital signs reviewed and stable and Patient moving all extremities  Post vital signs: Reviewed and stable  Last Vitals:  Vitals:   02/18/17 1122  BP: 120/75  Pulse: 88  Resp: 18  Temp: 36.8 C    Last Pain:  Vitals:   02/18/17 1122  TempSrc: Oral      Patients Stated Pain Goal: 3 (44/92/01 0071)  Complications: No apparent anesthesia complications

## 2017-02-19 ENCOUNTER — Encounter (HOSPITAL_COMMUNITY): Payer: Self-pay | Admitting: Obstetrics & Gynecology

## 2017-02-22 LAB — GC/CHLAMYDIA PROBE AMP
Chlamydia trachomatis, NAA: NEGATIVE
NEISSERIA GONORRHOEAE BY PCR: NEGATIVE

## 2017-02-22 LAB — SPECIMEN STATUS REPORT

## 2017-02-27 ENCOUNTER — Encounter (HOSPITAL_COMMUNITY): Payer: Self-pay | Admitting: *Deleted

## 2017-02-27 ENCOUNTER — Inpatient Hospital Stay (HOSPITAL_COMMUNITY)
Admission: AD | Admit: 2017-02-27 | Discharge: 2017-02-27 | Disposition: A | Discharge status: Home / Self Care | Payer: Medicaid Other | Source: Ambulatory Visit | Attending: Obstetrics & Gynecology | Admitting: Obstetrics & Gynecology

## 2017-02-27 ENCOUNTER — Inpatient Hospital Stay (HOSPITAL_COMMUNITY): Payer: Medicaid Other

## 2017-02-27 DIAGNOSIS — Z87891 Personal history of nicotine dependence: Secondary | ICD-10-CM | POA: Insufficient documentation

## 2017-02-27 DIAGNOSIS — D500 Iron deficiency anemia secondary to blood loss (chronic): Secondary | ICD-10-CM | POA: Diagnosis not present

## 2017-02-27 DIAGNOSIS — G8918 Other acute postprocedural pain: Secondary | ICD-10-CM | POA: Insufficient documentation

## 2017-02-27 DIAGNOSIS — O034 Incomplete spontaneous abortion without complication: Secondary | ICD-10-CM

## 2017-02-27 DIAGNOSIS — O209 Hemorrhage in early pregnancy, unspecified: Secondary | ICD-10-CM

## 2017-02-27 LAB — CBC
HEMATOCRIT: 30.1 % — AB (ref 36.0–46.0)
Hemoglobin: 9.8 g/dL — ABNORMAL LOW (ref 12.0–15.0)
MCH: 28 pg (ref 26.0–34.0)
MCHC: 32.6 g/dL (ref 30.0–36.0)
MCV: 86 fL (ref 78.0–100.0)
Platelets: 343 10*3/uL (ref 150–400)
RBC: 3.5 MIL/uL — AB (ref 3.87–5.11)
RDW: 15 % (ref 11.5–15.5)
WBC: 10.2 10*3/uL (ref 4.0–10.5)

## 2017-02-27 MED ORDER — OXYCODONE-ACETAMINOPHEN 5-325 MG PO TABS
2.0000 | ORAL_TABLET | Freq: Once | ORAL | Status: AC
  Administered 2017-02-27: 2 via ORAL
  Filled 2017-02-27: qty 2

## 2017-02-27 MED ORDER — MISOPROSTOL 200 MCG PO TABS
800.0000 ug | ORAL_TABLET | Freq: Once | ORAL | Status: AC
  Administered 2017-02-27: 800 ug via RECTAL
  Filled 2017-02-27: qty 4

## 2017-02-27 MED ORDER — HYDROCODONE-ACETAMINOPHEN 5-325 MG PO TABS: 1.0000 | ORAL_TABLET | Freq: Four times a day (QID) | ORAL | 0 refills | Status: DC | PRN

## 2017-02-27 MED ORDER — FERROUS SULFATE 325 (65 FE) MG PO TABS: 325.0000 mg | ORAL_TABLET | Freq: Two times a day (BID) | ORAL | 1 refills | Status: DC

## 2017-02-27 NOTE — MAU Note (Signed)
Patient presents with heavy bleeding since Monday, had D&C on 5/18, passing clots this morning, abdominal pain.

## 2017-02-27 NOTE — MAU Note (Signed)
Ultrasound at bedside

## 2017-02-27 NOTE — MAU Provider Note (Signed)
Chief Complaint: Vaginal Bleeding and Abdominal Cramping   First Provider Initiated Contact with Patient 02/27/17 0914      SUBJECTIVE HPI: Patricia Wise is a 36 y.o. [id number] on POD #8 following D&C for missed ab who presents to maternity admissions reporting heavy vaginal bleeding and abdominal cramping.  She reports little pain or bleeding for the first few days after surgery, then 5-6 days ago felt flu-like but did not run a fever and started having light menstrual-like bleeding. Until today, pain was well managed with prescribed pain medication and bleeding was light to moderate, and only requiring a pad change every few hours.  She did report going to [facility] and to a barbeque during the last few days so has been more active.  Today, she reports the pain is the same as it has been but bleeding is much heavier, with large clots and bleeding down to the floor while standing in the bathroom.  She denies fatigue, h/a, shortness of breath, chest pain, or palpitations. She has not taken any medications today for pain or bleeding, nothing makes it better or worse. She denies vaginal itching/burning, urinary symptoms, h/a, dizziness, n/v, or fever/chills.     HPI  Past Medical History:  Diagnosis Date  . mrsa   . Preterm labor    07/2007  . Vaginal Pap smear, abnormal    Past Surgical History:  Procedure Laterality Date  . BREAST SURGERY     augmentation  . CESAREAN SECTION    . CESAREAN SECTION N/A 08/14/2015   Procedure: REPEAT CESAREAN SECTION;  Surgeon: Donnamae Jude, MD;  Location: [facility];  Service: Obstetrics;  Laterality: N/A;  Requested 08/14/15 @ 12:30p  . DILATION AND CURETTAGE OF UTERUS    . DILATION AND EVACUATION N/A 02/18/2017   Procedure: DILATATION AND EVACUATION;  Surgeon: Osborne Oman, MD;  Location: [facility];  Service: Gynecology;  Laterality: N/A;  . GASTRIC BYPASS    . TONSILLECTOMY    . WRIST SURGERY Bilateral    carpal tunnel   Social History    Social History  . Marital status: Single    Spouse name: N/A  . Number of children: N/A  . Years of education: N/A   Occupational History  . Not on file.   Social History Main Topics  . Smoking status: Former Smoker    Quit date: 08/04/2012  . Smokeless tobacco: Never Used  . Alcohol use Yes     Comment: OCCASIONAL  . Drug use: No  . Sexual activity: Yes    Birth control/ protection: None   Other Topics Concern  . Not on file   Social History Narrative  . No narrative on file   No current facility-administered medications on file prior to encounter.    No current outpatient prescriptions on file prior to encounter.   No Known Allergies  ROS:  Review of Systems  Constitutional: Negative for chills, fatigue and fever.  Respiratory: Negative for shortness of breath.   Cardiovascular: Negative for chest pain.  Gastrointestinal: Negative for diarrhea, nausea and vomiting.  Genitourinary: Positive for pelvic pain and vaginal bleeding. Negative for difficulty urinating, dysuria, flank pain, vaginal discharge and vaginal pain.  Neurological: Negative for dizziness and headaches.  Psychiatric/Behavioral: Negative.      I have reviewed patient's Past Medical Hx, Surgical Hx, Family Hx, Social Hx, medications and allergies.   Physical Exam   Patient Vitals for the past 24 hrs:  BP Temp Pulse Resp  02/27/17 1226  112/69 - 79 16  02/27/17 0901 119/87 98.5 F (36.9 C) - -   Constitutional: Well-developed, well-nourished female in no acute distress.  Cardiovascular: normal rate Respiratory: normal effort GI: Abd soft, non-tender. Pos BS x 4 MS: Extremities nontender, no edema, normal ROM Neurologic: Alert and oriented x 4.  GU: Neg CVAT.  PELVIC EXAM: Cervix pink, visually closed, without lesion, large amount of dark red bleeding with several large clots removed during exam, vaginal walls and external genitalia normal Bimanual exam: Cervix 0/long/high, firm, anterior,  neg CMT, uterus tender, nonenlarged, adnexa without tenderness, enlargement, or mass   LAB RESULTS Results for orders placed or performed during the hospital encounter of 02/27/17 (from the past 24 hour(s))  CBC     Status: Abnormal   Collection Time: 02/27/17 10:46 AM  Result Value Ref Range   WBC 10.2 4.0 - 10.5 K/uL   RBC 3.50 (L) 3.87 - 5.11 MIL/uL   Hemoglobin 9.8 (L) 12.0 - 15.0 g/dL   HCT 30.1 (L) 36.0 - 46.0 %   MCV 86.0 78.0 - 100.0 fL   MCH 28.0 26.0 - 34.0 pg   MCHC 32.6 30.0 - 36.0 g/dL   RDW 15.0 11.5 - 15.5 %   Platelets 343 150 - 400 K/uL       IMAGING US Ob Comp Less 14 Wks  Result Date: 02/27/2017 CLINICAL DATA:  Excessive bleeding status post D&C EXAM: OBSTETRIC <14 WK [place] AND TRANSVAGINAL OB US TECHNIQUE: Both transabdominal and transvaginal ultrasound examinations were performed for complete evaluation of the gestation as well as the maternal uterus, adnexal regions, and pelvic cul-de-sac. Transvaginal technique was performed to assess early pregnancy. COMPARISON:  02/15/2017 FINDINGS: Intrauterine gestational sac: None Maternal uterus/adnexae: Endometrial complex measures 11 mm, mildly thickened, better evaluated on transabdominal imaging. Mild associated vascularity (image 12). Trace fluid in the uterine fundus. Bilateral ovaries are within normal limits. Trace pelvic fluid. IMPRESSION: No IUP is visualized status post D&C. Endometrial complex mildly thickened, measuring 11 mm, with mild vascularity. While equivocal, the appearance is considered worrisome for retained products of conception in the appropriate clinical setting. Electronically Signed   By: Julian Hy M.D.   On: 02/27/2017 10:38   US Ob Comp Less 14 Wks  Result Date: 02/15/2017 CLINICAL DATA:  Viability EXAM: OBSTETRIC <14 WK [place] AND TRANSVAGINAL OB US TECHNIQUE: Both transabdominal and transvaginal ultrasound examinations were performed for complete evaluation of the gestation as well as the  maternal uterus, adnexal regions, and pelvic cul-de-sac. Transvaginal technique was performed to assess early pregnancy. COMPARISON:  None. FINDINGS: Intrauterine gestational sac: Single Yolk sac:  Visualized Embryo:  Not visualized Cardiac Activity: Not visualized Heart Rate:   bpm MSD: 45  mm   10 w   0  d CRL:    mm    w    d                  [place] EDC: Subchorionic hemorrhage:  Small subchorionic hemorrhage Maternal uterus/adnexae: No adnexal masses or free fluid IMPRESSION: Ten week intrauterine pregnancy by mean sac diameter. Yolk sac is visualized, but no fetal pole. Findings meet definitive criteria for failed pregnancy. This follows SRU consensus guidelines: Diagnostic Criteria for Nonviable Pregnancy Early in the First Trimester. Alison Stalling J Med [phone]. Small subchorionic hemorrhage. Electronically Signed   By: Rolm Baptise M.D.   On: 02/15/2017 12:53   US Ob Transvaginal  Result Date: 02/27/2017 CLINICAL DATA:  Excessive bleeding status post D&C EXAM:  OBSTETRIC <14 WK [place] AND TRANSVAGINAL OB US TECHNIQUE: Both transabdominal and transvaginal ultrasound examinations were performed for complete evaluation of the gestation as well as the maternal uterus, adnexal regions, and pelvic cul-de-sac. Transvaginal technique was performed to assess early pregnancy. COMPARISON:  02/15/2017 FINDINGS: Intrauterine gestational sac: None Maternal uterus/adnexae: Endometrial complex measures 11 mm, mildly thickened, better evaluated on transabdominal imaging. Mild associated vascularity (image 12). Trace fluid in the uterine fundus. Bilateral ovaries are within normal limits. Trace pelvic fluid. IMPRESSION: No IUP is visualized status post D&C. Endometrial complex mildly thickened, measuring 11 mm, with mild vascularity. While equivocal, the appearance is considered worrisome for retained products of conception in the appropriate clinical setting. Electronically Signed   By: Julian Hy M.D.   On: 02/27/2017  10:38   US Ob Transvaginal  Result Date: 02/15/2017 CLINICAL DATA:  Viability EXAM: OBSTETRIC <14 WK [place] AND TRANSVAGINAL OB US TECHNIQUE: Both transabdominal and transvaginal ultrasound examinations were performed for complete evaluation of the gestation as well as the maternal uterus, adnexal regions, and pelvic cul-de-sac. Transvaginal technique was performed to assess early pregnancy. COMPARISON:  None. FINDINGS: Intrauterine gestational sac: Single Yolk sac:  Visualized Embryo:  Not visualized Cardiac Activity: Not visualized Heart Rate:   bpm MSD: 45  mm   10 w   0  d CRL:    mm    w    d                  [place] EDC: Subchorionic hemorrhage:  Small subchorionic hemorrhage Maternal uterus/adnexae: No adnexal masses or free fluid IMPRESSION: Ten week intrauterine pregnancy by mean sac diameter. Yolk sac is visualized, but no fetal pole. Findings meet definitive criteria for failed pregnancy. This follows SRU consensus guidelines: Diagnostic Criteria for Nonviable Pregnancy Early in the First Trimester. Alison Stalling J Med [phone]. Small subchorionic hemorrhage. Electronically Signed   By: Rolm Baptise M.D.   On: 02/15/2017 12:53    MAU Management/MDM: Ordered labs and [place] and reviewed results.  Drop in hgb since D&C from 13 to 9.8 but pt asymptomatic.  Cytotec 800 mcg pv given in MAU and bleeding reduced significantly.  Consult Dr Ihor Dow with assessment and findings.  With decreased bleeding and no s/sx of anemia, plan to D/C home with PO iron.  Pt to follow up at [facility] as scheduled, return to MAU as needed for emergencies.  Treatments in MAU included Cytotec 800 mcg pv and Percocet 5/325 x 2 tabs. Pt stable at time of discharge.  ASSESSMENT 1. Iron deficiency anemia due to chronic blood loss   2. Postoperative pain   3. Vaginal bleeding in pregnancy, first trimester   4. Retained products of conception after miscarriage     PLAN Discharge home Allergies as of 02/27/2017   No Known  Allergies     Medication List    STOP taking these medications   acetaminophen 500 MG tablet Commonly known as:  TYLENOL     TAKE these medications   ferrous sulfate 325 (65 FE) MG tablet Commonly known as:  FERROUSUL Take 1 tablet (325 mg total) by mouth 2 (two) times daily.   flintstones complete 60 MG chewable tablet Chew 2 tablets by mouth daily.   HYDROcodone-acetaminophen 5-325 MG tablet Commonly known as:  NORCO/VICODIN Take 1-2 tablets by mouth every 6 (six) hours as needed. What changed:  how much to take      [facility] for [facility] Follow  up.   Specialty:  Obstetrics and Gynecology Why:  As scheduled, return to MAU as needed for emergencies. Contact information: [facility] [place] Certified Nurse-Midwife 02/27/2017  5:40 PM

## 2017-02-27 NOTE — MAU Note (Signed)
Bleeding assessed no active bleeding noted.

## 2017-03-17 ENCOUNTER — Ambulatory Visit (INDEPENDENT_AMBULATORY_CARE_PROVIDER_SITE_OTHER): Payer: Medicaid Other | Admitting: Obstetrics & Gynecology

## 2017-03-17 ENCOUNTER — Other Ambulatory Visit (HOSPITAL_COMMUNITY)
Admission: RE | Admit: 2017-03-17 | Discharge: 2017-03-17 | Disposition: A | Discharge status: Home / Self Care | Payer: Medicaid Other | Source: Ambulatory Visit | Attending: Obstetrics & Gynecology | Admitting: Obstetrics & Gynecology

## 2017-03-17 VITALS — BP 116/75 | HR 82

## 2017-03-17 DIAGNOSIS — N898 Other specified noninflammatory disorders of vagina: Secondary | ICD-10-CM | POA: Diagnosis not present

## 2017-03-17 DIAGNOSIS — Z113 Encounter for screening for infections with a predominantly sexual mode of transmission: Secondary | ICD-10-CM | POA: Diagnosis not present

## 2017-03-17 DIAGNOSIS — O021 Missed abortion: Secondary | ICD-10-CM

## 2017-03-17 DIAGNOSIS — Z09 Encounter for follow-up examination after completed treatment for conditions other than malignant neoplasm: Secondary | ICD-10-CM | POA: Diagnosis present

## 2017-03-17 MED ORDER — FLUCONAZOLE 150 MG PO TABS: 150.0000 mg | ORAL_TABLET | Freq: Once | ORAL | 3 refills | Status: AC

## 2017-03-17 NOTE — Patient Instructions (Signed)
Return to clinic for any scheduled appointments or for any gynecologic concerns as needed.   

## 2017-03-17 NOTE — Progress Notes (Signed)
Subjective:     Patricia Wise is a 36 y.o. [phone] female who presents to the clinic 4 weeks s/p D&E for MAB at 11 weeks.   Procedure complicated by bleeding a week after that needed visit to ER and administration of Cytotec despite positive surgical pathology.  No retained POCs seen.  Eating a regular diet without difficulty. Bowel movements are normal. The patient is not having any pain.  She reports having pruritic vaginal discharge for a few days.  The following portions of the patient's history were reviewed and updated as appropriate: allergies, current medications, past family history, past medical history, past social history, past surgical history and problem list.  Normal pap on 11/07/15.  Review of Systems Pertinent items noted in HPI and remainder of comprehensive ROS otherwise negative.    Objective:    BP 116/75   Pulse 82   LMP 12/03/2016 (LMP Unknown)  General:  alert and no distress  Abdomen: soft, bowel sounds active, non-tender  Pelvic:   No bleeding. White discharge with some irritation on vulva, testing sample obtained.     Assessment:    Doing well postoperatively. Operative findings again reviewed. Pathology report discussed.   Has vulvovaginitis.   Plan:   1. Continue any current medications. 2. Will follow up discharge analysis.  Diflucan presumptively prescribed. 3. Activity restrictions: none 4. Anticipated return to work: not applicable. 5. Follow up as needed   Verita Schneiders, MD, [place] Attending [place], [facility] for [employer], [place]

## 2017-03-18 LAB — CERVICOVAGINAL ANCILLARY ONLY
Bacterial vaginitis: NEGATIVE
Candida vaginitis: POSITIVE — AB
Chlamydia: NEGATIVE
Neisseria Gonorrhea: NEGATIVE
Trichomonas: NEGATIVE

## 2017-05-04 DIAGNOSIS — K760 Fatty (change of) liver, not elsewhere classified: Secondary | ICD-10-CM

## 2017-05-04 HISTORY — DX: Fatty (change of) liver, not elsewhere classified: K76.0

## 2017-08-15 ENCOUNTER — Encounter (HOSPITAL_COMMUNITY): Payer: Self-pay | Admitting: Emergency Medicine

## 2017-08-15 ENCOUNTER — Emergency Department (HOSPITAL_COMMUNITY): Payer: Medicaid Other

## 2017-08-15 ENCOUNTER — Observation Stay (HOSPITAL_COMMUNITY)
Admission: EM | Admit: 2017-08-15 | Discharge: 2017-08-16 | Disposition: A | Discharge status: Home / Self Care | Payer: Medicaid Other | Attending: Nephrology | Admitting: Nephrology

## 2017-08-15 ENCOUNTER — Other Ambulatory Visit: Payer: Self-pay

## 2017-08-15 DIAGNOSIS — E860 Dehydration: Secondary | ICD-10-CM | POA: Diagnosis not present

## 2017-08-15 DIAGNOSIS — D509 Iron deficiency anemia, unspecified: Secondary | ICD-10-CM | POA: Diagnosis not present

## 2017-08-15 DIAGNOSIS — R109 Unspecified abdominal pain: Secondary | ICD-10-CM | POA: Diagnosis present

## 2017-08-15 DIAGNOSIS — K289 Gastrojejunal ulcer, unspecified as acute or chronic, without hemorrhage or perforation: Principal | ICD-10-CM

## 2017-08-15 DIAGNOSIS — K2900 Acute gastritis without bleeding: Secondary | ICD-10-CM | POA: Diagnosis present

## 2017-08-15 DIAGNOSIS — E876 Hypokalemia: Secondary | ICD-10-CM | POA: Diagnosis present

## 2017-08-15 DIAGNOSIS — Z9884 Bariatric surgery status: Secondary | ICD-10-CM | POA: Diagnosis not present

## 2017-08-15 DIAGNOSIS — Z79899 Other long term (current) drug therapy: Secondary | ICD-10-CM | POA: Insufficient documentation

## 2017-08-15 DIAGNOSIS — Z87891 Personal history of nicotine dependence: Secondary | ICD-10-CM | POA: Insufficient documentation

## 2017-08-15 HISTORY — DX: Acute vaginitis: N76.0

## 2017-08-15 HISTORY — DX: Morbid (severe) obesity due to excess calories: E66.01

## 2017-08-15 HISTORY — DX: Other specified bacterial agents as the cause of diseases classified elsewhere: B96.89

## 2017-08-15 HISTORY — DX: Anemia, unspecified: D64.9

## 2017-08-15 HISTORY — DX: Fatty (change of) liver, not elsewhere classified: K76.0

## 2017-08-15 LAB — URINALYSIS, ROUTINE W REFLEX MICROSCOPIC
Bacteria, UA: NONE SEEN
Bilirubin Urine: NEGATIVE
GLUCOSE, UA: NEGATIVE mg/dL
Ketones, ur: NEGATIVE mg/dL
Leukocytes, UA: NEGATIVE
NITRITE: NEGATIVE
PH: 7 (ref 5.0–8.0)
Protein, ur: NEGATIVE mg/dL
SPECIFIC GRAVITY, URINE: 1.014 (ref 1.005–1.030)

## 2017-08-15 LAB — CBC
HCT: 35.7 % — ABNORMAL LOW (ref 36.0–46.0)
HEMOGLOBIN: 11.4 g/dL — AB (ref 12.0–15.0)
MCH: 27.7 pg (ref 26.0–34.0)
MCHC: 31.9 g/dL (ref 30.0–36.0)
MCV: 86.9 fL (ref 78.0–100.0)
Platelets: 254 10*3/uL (ref 150–400)
RBC: 4.11 MIL/uL (ref 3.87–5.11)
RDW: 15.9 % — AB (ref 11.5–15.5)
WBC: 10.6 10*3/uL — ABNORMAL HIGH (ref 4.0–10.5)

## 2017-08-15 LAB — I-STAT BETA HCG BLOOD, ED (MC, WL, AP ONLY): I-stat hCG, quantitative: <5 m[IU]/mL (ref <5)

## 2017-08-15 LAB — LIPASE, BLOOD: LIPASE: 27 U/L (ref 11–51)

## 2017-08-15 LAB — COMPREHENSIVE METABOLIC PANEL
ALBUMIN: 4.4 g/dL (ref 3.5–5.0)
ALT: 17 U/L (ref 14–54)
ANION GAP: 6 (ref 5–15)
AST: 27 U/L (ref 15–41)
Alkaline Phosphatase: 71 U/L (ref 38–126)
BILIRUBIN TOTAL: 0.6 mg/dL (ref 0.3–1.2)
BUN: 13 mg/dL (ref 6–20)
CHLORIDE: 105 mmol/L (ref 101–111)
CO2: 26 mmol/L (ref 22–32)
Calcium: 9.1 mg/dL (ref 8.9–10.3)
Creatinine, Ser: 0.79 mg/dL (ref 0.44–1.00)
GFR calc Af Amer: >60 mL/min (ref >60)
GFR calc non Af Amer: >60 mL/min (ref >60)
GLUCOSE: 120 mg/dL — AB (ref 65–99)
POTASSIUM: 3.4 mmol/L — AB (ref 3.5–5.1)
SODIUM: 137 mmol/L (ref 135–145)
TOTAL PROTEIN: 7.7 g/dL (ref 6.5–8.1)

## 2017-08-15 MED ORDER — IOPAMIDOL (ISOVUE-300) INJECTION 61%
100.0000 mL | Freq: Once | INTRAVENOUS | Status: AC | PRN
  Administered 2017-08-15: 100 mL via INTRAVENOUS

## 2017-08-15 MED ORDER — MORPHINE SULFATE (PF) 4 MG/ML IV SOLN
6.0000 mg | Freq: Once | INTRAVENOUS | Status: AC
  Administered 2017-08-15: 6 mg via INTRAVENOUS
  Filled 2017-08-15: qty 2

## 2017-08-15 MED ORDER — PANTOPRAZOLE SODIUM 40 MG IV SOLR: 40.0000 mg | Freq: Two times a day (BID) | INTRAVENOUS | Status: DC

## 2017-08-15 MED ORDER — IOPAMIDOL (ISOVUE-300) INJECTION 61%
INTRAVENOUS | Status: AC
  Administered 2017-08-15: 30 mL via ORAL
  Filled 2017-08-15: qty 30

## 2017-08-15 MED ORDER — IOPAMIDOL (ISOVUE-300) INJECTION 61%
30.0000 mL | Freq: Once | INTRAVENOUS | Status: AC | PRN
  Administered 2017-08-15: 30 mL via ORAL

## 2017-08-15 MED ORDER — SODIUM CHLORIDE 0.9 % IV SOLN
80.0000 mg | Freq: Once | INTRAVENOUS | Status: AC
  Administered 2017-08-16: 80 mg via INTRAVENOUS
  Filled 2017-08-15: qty 80

## 2017-08-15 MED ORDER — PANTOPRAZOLE SODIUM 40 MG IV SOLR
40.0000 mg | Freq: Once | INTRAVENOUS | Status: AC
  Administered 2017-08-15: 40 mg via INTRAVENOUS
  Filled 2017-08-15: qty 40

## 2017-08-15 MED ORDER — SODIUM CHLORIDE 0.9 % IV SOLN
8.0000 mg/h | INTRAVENOUS | Status: DC
  Administered 2017-08-16: 8 mg/h via INTRAVENOUS
  Filled 2017-08-15 (×2): qty 80

## 2017-08-15 MED ORDER — IOPAMIDOL (ISOVUE-300) INJECTION 61%
INTRAVENOUS | Status: AC
  Filled 2017-08-15: qty 100

## 2017-08-15 NOTE — ED Provider Notes (Signed)
[facility] DEPT Provider Note   CSN: [id number] Arrival date & time: 08/15/17  1944     History   Chief Complaint Chief Complaint  Patient presents with  . Abdominal Pain    HPI Patricia Wise is a 36 y.o. female.  HPI Patient is a 36 year old female with a history of gastric bypass 5 years ago.  This was completed in [place].  This was a Roux-en-Y procedure.  She presents emergency department several weeks of upper abdominal discomfort while eating and now over the past 48 hours every time she tries to eat or drink she has severe pain. Denies nausea, denies vomiting..  She has been able to keep down small amounts of fluid but has not been able to tolerate solids of the past 24 hours secondary to pain associated witheating.  She reports severe upper and left upper quadrant abdominal pain with eating.  No fevers or chills.  Patient has never had pain like this before.   Past Medical History:  Diagnosis Date  . mrsa   . Preterm labor    07/2007  . Vaginal Pap smear, abnormal     Patient Active Problem List   Diagnosis Date Noted  . Missed abortion 02/15/2017  . History of C-section 06/24/2015  . Abnormal Pap smear of cervix 06/16/2015  . Chlamydia 06/16/2015  . Thyroid disease 06/16/2015  . H/O gastric bypass 06/16/2015    Past Surgical History:  Procedure Laterality Date  . BREAST SURGERY     augmentation  . CESAREAN SECTION    . DILATION AND CURETTAGE OF UTERUS    . GASTRIC BYPASS    . TONSILLECTOMY    . WRIST SURGERY Bilateral    carpal tunnel    OB History    Gravida Para Term Preterm AB Living   3 2 1 1 1 2    SAB TAB Ectopic Multiple Live Births   1     0 2       Home Medications    Prior to Admission medications   Medication Sig Start Date End Date Taking? Authorizing Provider  ferrous sulfate (FERROUSUL) 325 (65 FE) MG tablet Take 1 tablet (325 mg total) by mouth 2 (two) times daily. Patient not taking:  Reported on 03/17/2017 02/27/17   Elvera Maria, CNM  HYDROcodone-acetaminophen (NORCO/VICODIN) 5-325 MG tablet Take 1-2 tablets by mouth every 6 (six) hours as needed. Patient not taking: Reported on 03/17/2017 02/27/17   Elvera Maria, CNM    Family History Family History  Problem Relation Age of Onset  . Cancer Mother     Social History Social History   Tobacco Use  . Smoking status: Former Smoker    Last attempt to quit: 08/04/2012    Years since quitting: 5.0  . Smokeless tobacco: Never Used  Substance Use Topics  . Alcohol use: Yes    Comment: OCCASIONAL  . Drug use: No     Allergies   Patient has no known allergies.   Review of Systems Review of Systems  All other systems reviewed and are negative.    Physical Exam Updated Vital Signs BP 119/86   Pulse 63   Temp 98.2 F (36.8 C) (Oral)   Resp 17   Ht 5\' 6"  (1.676 m)   Wt 95.3 kg (210 lb)   SpO2 100%   BMI 33.89 kg/m   Physical Exam  Constitutional: She is oriented to person, place, and time. She appears well-developed and well-nourished.  HENT:  Head: Normocephalic.  Eyes: EOM are normal.  Neck: Normal range of motion.  Cardiovascular: Normal rate.  Pulmonary/Chest: Effort normal and breath sounds normal.  Abdominal: Soft. She exhibits no distension.  Upper quadrant abdominal tenderness  Musculoskeletal: Normal range of motion.  Neurological: She is alert and oriented to person, place, and time.  Psychiatric: She has a normal mood and affect.  Nursing note and vitals reviewed.    ED Treatments / Results  Labs (all labs ordered are listed, but only abnormal results are displayed) Labs Reviewed  COMPREHENSIVE METABOLIC PANEL - Abnormal; Notable for the following components:      Result Value   Potassium 3.4 (*)    Glucose, Bld 120 (*)    All other components within normal limits  CBC - Abnormal; Notable for the following components:   WBC 10.6 (*)    Hemoglobin 11.4 (*)      HCT 35.7 (*)    RDW 15.9 (*)    All other components within normal limits  URINALYSIS, ROUTINE W REFLEX MICROSCOPIC - Abnormal; Notable for the following components:   Hgb urine dipstick LARGE (*)    Squamous Epithelial / LPF 0-5 (*)    All other components within normal limits  LIPASE, BLOOD  I-STAT BETA HCG BLOOD, ED ([facility], [facility], [facility] ONLY)    EKG  EKG Interpretation None       Radiology Ct Abdomen Pelvis W Contrast  Result Date: 08/15/2017 CLINICAL DATA:  Abdominal pain after drinking water and solid foods. History of gastric bypass 5 years ago. EXAM: CT ABDOMEN AND PELVIS WITH CONTRAST TECHNIQUE: Multidetector CT imaging of the abdomen and pelvis was performed using the standard protocol following bolus administration of intravenous contrast. CONTRAST:  169mL ISOVUE-300 IOPAMIDOL (ISOVUE-300) INJECTION 61% COMPARISON:  None. FINDINGS: LOWER CHEST: Lung bases are clear. Included heart size is normal. No pericardial effusion. HEPATOBILIARY: 17 mm gallstone in gallbladder neck without CT findings of acute cholecystitis. Trace periportal edema. Mild hepatomegaly, 22 cm in cranial caudad dimension. PANCREAS: Normal. SPLEEN: Normal. ADRENALS/URINARY TRACT: Kidneys are orthotopic, demonstrating symmetric enhancement. No nephrolithiasis, hydronephrosis or solid renal masses. The unopacified ureters are normal in course and caliber. Urinary bladder is partially distended and unremarkable. Normal adrenal glands. STOMACH/BOWEL: Status post gastrojejunostomy. Focal out pouching at gastric anastomosis with surrounding inflammatory changes (axial 20/91). Focal narrowing of distal stomach proximal to area of outpouching (coronal 52/127). Enteric contrast has not yet reached the distal small bowel. Normal appendix. VASCULAR/LYMPHATIC: Aortoiliac vessels are normal in course and caliber. No lymphadenopathy by CT size criteria. REPRODUCTIVE: Normal. OTHER: No intraperitoneal free fluid or free air.  MUSCULOSKELETAL: Nonacute.  Status post bilateral breast implants. IMPRESSION: 1. Status post gastric bypass. Distal stomach focal outpouching with extensive inflammation concerning for non perforated ulcer. In addition, possible gastric stricture. Anatomy would be better evaluated on upper GI versus endoscopy. 2. Cholelithiasis without CT findings of acute cholecystitis. 3. Mild hepatomegaly. 4. Acute findings discussed with and reconfirmed by Dr.Niccolo Burggraf on 08/15/2017 at 11:29 pm. Electronically Signed   By: Elon Alas M.D.   On: 08/15/2017 23:31    Procedures Procedures (including critical care time)  Medications Ordered in ED Medications  pantoprazole (PROTONIX) injection 40 mg (not administered)  pantoprazole (PROTONIX) 80 mg in sodium chloride 0.9 % 100 mL IVPB (not administered)  pantoprazole (PROTONIX) 80 mg in sodium chloride 0.9 % 250 mL (0.32 mg/mL) infusion (not administered)  morphine 4 MG/ML injection 6 mg (6 mg Intravenous Given 08/15/17 2123)  iopamidol (ISOVUE-300) 61 % injection 30 mL (30 mLs Oral Contrast Given 08/15/17 2138)  iopamidol (ISOVUE-300) 61 % injection 100 mL (100 mLs Intravenous Contrast Given 08/15/17 2232)     Initial Impression / Assessment and Plan / ED Course  I have reviewed the triage vital signs and the nursing notes.  Pertinent labs & imaging results that were available during my care of the patient were reviewed by me and considered in my medical decision making (see chart for details).     Patient with likely developing gastric ulcer.  IV Protonix now.  Patient will need endoscopy.  Given her severe crippling pain over the past 24 hours associated with eating I think she needs this sooner rather than later.  Patient will benefit from acute hospitalization with GI consultation in the morning.  I suspect she will need endoscopy tomorrow.  She is at high risk for perforation.  She may be developing a gastric stricture may be developing near  gastric outlet obstruction.  She is not completely obstructed at this time as she does have contrast in the proximal small bowel  Final Clinical Impressions(s) / ED Diagnoses   Final diagnoses:  Acute gastritis, presence of bleeding unspecified, unspecified gastritis type    ED Discharge Orders    None       Jola Schmidt, MD 08/15/17 2358

## 2017-08-15 NOTE — ED Notes (Signed)
Pt arrived back from CT scan.

## 2017-08-15 NOTE — ED Triage Notes (Signed)
Pt states that she had gastric bypass surgery 5 years ago.  Pt woke up yesterday and experienced upper abd pain after drinking water.  Pt tried to eat waffles and mashed potatoes later and experienced the same upper abd pain.  Denies any NVD.  No other sx.  [facility] and they sent her here without seeing her.

## 2017-08-16 ENCOUNTER — Observation Stay (HOSPITAL_COMMUNITY): Payer: Medicaid Other | Admitting: Registered Nurse

## 2017-08-16 ENCOUNTER — Encounter (HOSPITAL_COMMUNITY)
Admission: EM | Disposition: A | Discharge status: Home / Self Care | Payer: Self-pay | Source: Home / Self Care | Attending: Emergency Medicine

## 2017-08-16 ENCOUNTER — Encounter (HOSPITAL_COMMUNITY): Payer: Self-pay | Admitting: Internal Medicine

## 2017-08-16 DIAGNOSIS — D649 Anemia, unspecified: Secondary | ICD-10-CM

## 2017-08-16 DIAGNOSIS — R112 Nausea with vomiting, unspecified: Secondary | ICD-10-CM | POA: Diagnosis not present

## 2017-08-16 DIAGNOSIS — K253 Acute gastric ulcer without hemorrhage or perforation: Secondary | ICD-10-CM | POA: Diagnosis not present

## 2017-08-16 DIAGNOSIS — Z9884 Bariatric surgery status: Secondary | ICD-10-CM | POA: Diagnosis not present

## 2017-08-16 DIAGNOSIS — E876 Hypokalemia: Secondary | ICD-10-CM | POA: Diagnosis present

## 2017-08-16 DIAGNOSIS — R1012 Left upper quadrant pain: Secondary | ICD-10-CM

## 2017-08-16 DIAGNOSIS — D509 Iron deficiency anemia, unspecified: Secondary | ICD-10-CM | POA: Diagnosis not present

## 2017-08-16 DIAGNOSIS — K2900 Acute gastritis without bleeding: Secondary | ICD-10-CM

## 2017-08-16 DIAGNOSIS — E860 Dehydration: Secondary | ICD-10-CM | POA: Diagnosis not present

## 2017-08-16 DIAGNOSIS — R1013 Epigastric pain: Secondary | ICD-10-CM | POA: Diagnosis not present

## 2017-08-16 DIAGNOSIS — K289 Gastrojejunal ulcer, unspecified as acute or chronic, without hemorrhage or perforation: Secondary | ICD-10-CM | POA: Diagnosis not present

## 2017-08-16 DIAGNOSIS — R935 Abnormal findings on diagnostic imaging of other abdominal regions, including retroperitoneum: Secondary | ICD-10-CM

## 2017-08-16 DIAGNOSIS — R140 Abdominal distension (gaseous): Secondary | ICD-10-CM

## 2017-08-16 DIAGNOSIS — R109 Unspecified abdominal pain: Secondary | ICD-10-CM | POA: Diagnosis not present

## 2017-08-16 HISTORY — PX: ESOPHAGOGASTRODUODENOSCOPY (EGD) WITH PROPOFOL: SHX5813

## 2017-08-16 LAB — PHOSPHORUS: Phosphorus: 3.8 mg/dL (ref 2.5–4.6)

## 2017-08-16 LAB — IRON AND TIBC
IRON: 52 ug/dL (ref 28–170)
Saturation Ratios: 15 % (ref 10.4–31.8)
TIBC: 336 ug/dL (ref 250–450)
UIBC: 284 ug/dL

## 2017-08-16 LAB — COMPREHENSIVE METABOLIC PANEL
ALT: 15 U/L (ref 14–54)
AST: 21 U/L (ref 15–41)
Albumin: 3.6 g/dL (ref 3.5–5.0)
Alkaline Phosphatase: 63 U/L (ref 38–126)
Anion gap: 6 (ref 5–15)
BUN: 10 mg/dL (ref 6–20)
CHLORIDE: 106 mmol/L (ref 101–111)
CO2: 25 mmol/L (ref 22–32)
Calcium: 8.6 mg/dL — ABNORMAL LOW (ref 8.9–10.3)
Creatinine, Ser: 0.71 mg/dL (ref 0.44–1.00)
Glucose, Bld: 100 mg/dL — ABNORMAL HIGH (ref 65–99)
POTASSIUM: 3.7 mmol/L (ref 3.5–5.1)
Sodium: 137 mmol/L (ref 135–145)
Total Bilirubin: 0.6 mg/dL (ref 0.3–1.2)
Total Protein: 6.2 g/dL — ABNORMAL LOW (ref 6.5–8.1)

## 2017-08-16 LAB — MAGNESIUM
MAGNESIUM: 2.1 mg/dL (ref 1.7–2.4)
Magnesium: 2 mg/dL (ref 1.7–2.4)

## 2017-08-16 LAB — TSH: TSH: 4.476 u[IU]/mL (ref 0.350–4.500)

## 2017-08-16 LAB — FERRITIN: Ferritin: 19 ng/mL (ref 11–307)

## 2017-08-16 LAB — RETICULOCYTES
RBC.: 3.61 MIL/uL — ABNORMAL LOW (ref 3.87–5.11)
RETIC COUNT ABSOLUTE: 36.1 10*3/uL (ref 19.0–186.0)
Retic Ct Pct: 1 % (ref 0.4–3.1)

## 2017-08-16 LAB — FOLATE: Folate: 15.6 ng/mL (ref >5.9)

## 2017-08-16 LAB — CBC
HEMATOCRIT: 31.2 % — AB (ref 36.0–46.0)
Hemoglobin: 9.9 g/dL — ABNORMAL LOW (ref 12.0–15.0)
MCH: 27.4 pg (ref 26.0–34.0)
MCHC: 31.7 g/dL (ref 30.0–36.0)
MCV: 86.4 fL (ref 78.0–100.0)
Platelets: 192 10*3/uL (ref 150–400)
RBC: 3.61 MIL/uL — AB (ref 3.87–5.11)
RDW: 15.6 % — ABNORMAL HIGH (ref 11.5–15.5)
WBC: 6.2 10*3/uL (ref 4.0–10.5)

## 2017-08-16 LAB — HIV ANTIBODY (ROUTINE TESTING W REFLEX): HIV Screen 4th Generation wRfx: NONREACTIVE

## 2017-08-16 LAB — VITAMIN B12: Vitamin B-12: 597 pg/mL (ref 180–914)

## 2017-08-16 SURGERY — ESOPHAGOGASTRODUODENOSCOPY (EGD) WITH PROPOFOL
Anesthesia: Monitor Anesthesia Care

## 2017-08-16 MED ORDER — ACETAMINOPHEN 650 MG RE SUPP
650.0000 mg | Freq: Four times a day (QID) | RECTAL | Status: DC | PRN
Start: 2017-08-16 — End: 2017-08-16

## 2017-08-16 MED ORDER — LIDOCAINE 2% (20 MG/ML) 5 ML SYRINGE
INTRAMUSCULAR | Status: AC
  Filled 2017-08-16: qty 5

## 2017-08-16 MED ORDER — PROPOFOL 10 MG/ML IV BOLUS
INTRAVENOUS | Status: AC
  Filled 2017-08-16: qty 40

## 2017-08-16 MED ORDER — ONDANSETRON HCL 4 MG PO TABS: 4.0000 mg | ORAL_TABLET | Freq: Four times a day (QID) | ORAL | Status: DC | PRN

## 2017-08-16 MED ORDER — POTASSIUM CHLORIDE 10 MEQ/100ML IV SOLN: 10.0000 meq | INTRAVENOUS | Status: AC

## 2017-08-16 MED ORDER — PANTOPRAZOLE SODIUM 40 MG PO TBEC: 40.0000 mg | DELAYED_RELEASE_TABLET | Freq: Two times a day (BID) | ORAL | 0 refills | Status: DC

## 2017-08-16 MED ORDER — PROPOFOL 10 MG/ML IV BOLUS
INTRAVENOUS | Status: DC | PRN
  Administered 2017-08-16: 30 mg via INTRAVENOUS

## 2017-08-16 MED ORDER — LIDOCAINE 2% (20 MG/ML) 5 ML SYRINGE
INTRAMUSCULAR | Status: DC | PRN
  Administered 2017-08-16: 100 mg via INTRAVENOUS

## 2017-08-16 MED ORDER — MORPHINE SULFATE (PF) 2 MG/ML IV SOLN
2.0000 mg | INTRAVENOUS | Status: DC | PRN
  Administered 2017-08-16: 4 mg via INTRAVENOUS
  Filled 2017-08-16: qty 2

## 2017-08-16 MED ORDER — ONDANSETRON HCL 4 MG/2ML IJ SOLN: 4.0000 mg | Freq: Four times a day (QID) | INTRAMUSCULAR | Status: DC | PRN

## 2017-08-16 MED ORDER — ONDANSETRON HCL 4 MG/2ML IJ SOLN
INTRAMUSCULAR | Status: AC
  Filled 2017-08-16: qty 2

## 2017-08-16 MED ORDER — ACETAMINOPHEN 325 MG PO TABS: 650.0000 mg | ORAL_TABLET | Freq: Four times a day (QID) | ORAL | Status: DC | PRN

## 2017-08-16 MED ORDER — PROPOFOL 500 MG/50ML IV EMUL
INTRAVENOUS | Status: DC | PRN
  Administered 2017-08-16: 125 ug/kg/min via INTRAVENOUS

## 2017-08-16 MED ORDER — PANTOPRAZOLE SODIUM 40 MG PO TBEC
40.0000 mg | DELAYED_RELEASE_TABLET | Freq: Two times a day (BID) | ORAL | Status: DC
  Filled 2017-08-16: qty 1

## 2017-08-16 MED ORDER — KCL IN DEXTROSE-NACL 20-5-0.9 MEQ/L-%-% IV SOLN
INTRAVENOUS | Status: DC
  Administered 2017-08-16: 11:00:00 via INTRAVENOUS
  Filled 2017-08-16: qty 1000

## 2017-08-16 MED ORDER — LACTATED RINGERS IV SOLN
INTRAVENOUS | Status: DC
  Administered 2017-08-16: 11:00:00 via INTRAVENOUS

## 2017-08-16 MED ORDER — SODIUM CHLORIDE 0.9 % IV SOLN
INTRAVENOUS | Status: DC
  Administered 2017-08-16: 08:00:00 via INTRAVENOUS

## 2017-08-16 MED ORDER — MORPHINE SULFATE (PF) 4 MG/ML IV SOLN
2.0000 mg | INTRAVENOUS | Status: DC | PRN
  Administered 2017-08-16: 2 mg via INTRAVENOUS
  Filled 2017-08-16: qty 1

## 2017-08-16 MED ORDER — ONDANSETRON HCL 4 MG/2ML IJ SOLN
INTRAMUSCULAR | Status: DC | PRN
  Administered 2017-08-16: 4 mg via INTRAVENOUS

## 2017-08-16 MED ORDER — SODIUM CHLORIDE 0.9 % IV SOLN: INTRAVENOUS | Status: DC

## 2017-08-16 MED ORDER — POTASSIUM CHLORIDE 10 MEQ/100ML IV SOLN
10.0000 meq | INTRAVENOUS | Status: AC
  Administered 2017-08-16 (×2): 10 meq via INTRAVENOUS
  Filled 2017-08-16 (×2): qty 100

## 2017-08-16 SURGICAL SUPPLY — 14 items

## 2017-08-16 NOTE — Progress Notes (Signed)
Report received from [facility].  Pt arrived to 1609 via WC and ambulated to bed without difficulty. Education initiated w/ pt on plan of care and diet. Pt verbalized understanding

## 2017-08-16 NOTE — Transfer of Care (Signed)
Immediate Anesthesia Transfer of Care Note  Patient: Patricia Wise  Procedure(s) Performed: ESOPHAGOGASTRODUODENOSCOPY (EGD) WITH PROPOFOL (N/A )  Patient Location: PACU and Endoscopy Unit  Anesthesia Type:MAC  Level of Consciousness: awake, alert , oriented and patient cooperative  Airway & Oxygen Therapy: Patient Spontanous Breathing and Patient connected to nasal cannula oxygen  Post-op Assessment: Report given to RN, Post -op Vital signs reviewed and stable and Patient moving all extremities  Post vital signs: Reviewed and stable  Last Vitals:  Vitals:   08/16/17 1149 08/16/17 1150  BP: (!) 95/44 98/64  Pulse: 69 68  Resp: 15 11  Temp:    SpO2: 100% 100%    Last Pain:  Vitals:   08/16/17 1149  TempSrc: Oral  PainSc:       Patients Stated Pain Goal: 0 (70/48/88 9169)  Complications: No apparent anesthesia complications

## 2017-08-16 NOTE — Anesthesia Preprocedure Evaluation (Signed)
Anesthesia Evaluation  Patient identified by MRN, date of birth, ID band Patient awake    Reviewed: Allergy & Precautions, NPO status , Patient's Chart, lab work & pertinent test results  History of Anesthesia Complications Negative for: history of anesthetic complications  Airway Mallampati: I  TM Distance: >3 FB Neck ROM: Full    Dental  (+) Dental Advisory Given   Pulmonary former smoker,    breath sounds clear to auscultation       Cardiovascular negative cardio ROS   Rhythm:Regular Rate:Normal     Neuro/Psych negative neurological ROS     GI/Hepatic Neg liver ROS, neg GERD  ,Abdominal pain   Endo/Other  Morbid obesity  Renal/GU negative Renal ROS     Musculoskeletal   Abdominal (+) + obese,   Peds  Hematology  (+) Blood dyscrasia (Hb 9.9), anemia ,   Anesthesia Other Findings   Reproductive/Obstetrics LMP presently                             Anesthesia Physical Anesthesia Plan  ASA: II  Anesthesia Plan: MAC   Post-op Pain Management:    Induction: Intravenous  PONV Risk Score and Plan: Ondansetron and Treatment may vary due to age or medical condition  Airway Management Planned: Natural Airway and Nasal Cannula  Additional Equipment:   Intra-op Plan:   Post-operative Plan:   Informed Consent: I have reviewed the patients History and Physical, chart, labs and discussed the procedure including the risks, benefits and alternatives for the proposed anesthesia with the patient or authorized representative who has indicated his/her understanding and acceptance.   Dental advisory given  Plan Discussed with: CRNA and Surgeon  Anesthesia Plan Comments: (Plan routine monitors, MAC)        Anesthesia Quick Evaluation

## 2017-08-16 NOTE — Discharge Summary (Signed)
Physician Discharge Summary  Patricia Wise WUJ:[id number] DOB: Nov 07, 1980 DOA: 08/15/2017  PCP: Patient, No Pcp Per  Admit date: 08/15/2017 Discharge date: 08/16/2017  Admitted From:home Disposition:home  Recommendations for Outpatient Follow-up:  1. Follow up with PCP in 1-2 weeks 2. Please obtain BMP/CBC in one week  Home Health:no Equipment/Devices:no Discharge Condition:stable CODE STATUS:full code Diet recommendation:regular  Brief/Interim Summary: 36 year old female with history of morbid obesity status post gastric bypass surgery in 2013 in [place], chronic iron deficiency anemia presented with progressively worsening epigastric abdominal pain associated with nausea vomiting.  Patient was found to have gastric ulcer in CT scan.  She underwent upper GI endoscopy by GI when she was found to have large ulcers just distal to the gastrojejunal anastomosis.  GI recommended to start on Protonix twice a day and follow-up with GI outpatient.  Patient verbalized understanding.  Advance diet and  tolerating well.  Patient verbalized understanding of follow-up instructions.  Stable on discharge.  Discharge Diagnoses:  Active Problems:   H/O gastric bypass   Hypokalemia   Continuous severe abdominal pain   Dehydration   Abdominal pain   Jejunal ulcer    Discharge Instructions  Discharge Instructions    Call MD for:  difficulty breathing, headache or visual disturbances   Complete by:  As directed    Call MD for:  extreme fatigue   Complete by:  As directed    Call MD for:  hives   Complete by:  As directed    Call MD for:  persistant dizziness or light-headedness   Complete by:  As directed    Call MD for:  persistant nausea and vomiting   Complete by:  As directed    Call MD for:  severe uncontrolled pain   Complete by:  As directed    Call MD for:  temperature >100.4   Complete by:  As directed    Diet general   Complete by:  As directed    Discharge instructions    Complete by:  As directed    Avoid NSAIDS   Increase activity slowly   Complete by:  As directed      Allergies as of 08/16/2017   No Known Allergies     Medication List    STOP taking these medications   HYDROcodone-acetaminophen 5-325 MG tablet Commonly known as:  NORCO/VICODIN     TAKE these medications   ferrous sulfate 325 (65 FE) MG tablet Commonly known as:  FERROUSUL Take 1 tablet (325 mg total) by mouth 2 (two) times daily.   pantoprazole 40 MG tablet Commonly known as:  PROTONIX Take 1 tablet (40 mg total) 2 (two) times daily by mouth.      Follow-up Information    Zehr, Laban Emperor, PA-C Follow up on 09/14/2017.   Specialty:  Gastroenterology Why:  11:00 AM Contact information: [address] [place] [address] [phone]          No Known Allergies  Consultations: GI  Procedures/Studies: Endoscopy  Subjective: Seen and examined patient twice a day.  In second visit patient came from endoscopy doing well.  Denies nausea vomiting abdominal pain.  Verbalized understanding of follow-up instructions and importance of taking Protonix.  Discharge Exam: Vitals:   08/16/17 1150 08/16/17 1200  BP: 98/64 103/73  Pulse: 68 89  Resp: 11 20  Temp:    SpO2: 100% 100%   Vitals:   08/16/17 1055 08/16/17 1149 08/16/17 1150 08/16/17 1200  BP: 115/68 (!) 95/44 98/64  103/73  Pulse: (!) 54 69 68 89  Resp: 12 15 11 20   Temp: 98.2 F (36.8 C) 98 F (36.7 C)    TempSrc: Oral Oral    SpO2: 100% 100% 100% 100%  Weight:      Height:        General: Pt is alert, awake, not in acute distress Cardiovascular: RRR, S1/S2 +, no rubs, no gallops Respiratory: CTA bilaterally, no wheezing, no rhonchi Abdominal: Soft, NT, ND, bowel sounds + Extremities: no edema, no cyanosis    The results of significant diagnostics from this hospitalization (including imaging, microbiology, ancillary and laboratory) are listed below for reference.     Microbiology: No  results found for this or any previous visit (from the past 240 hour(s)).   Labs: BNP (last 3 results) No results for input(s): BNP in the last 8760 hours. Basic Metabolic Panel: Recent Labs  Lab 08/15/17 2005 08/16/17 0841  NA 137 137  K 3.4* 3.7  CL 105 106  CO2 26 25  GLUCOSE 120* 100*  BUN 13 10  CREATININE 0.79 0.71  CALCIUM 9.1 8.6*  MG 2.1 2.0  PHOS  --  3.8   Liver Function Tests: Recent Labs  Lab 08/15/17 2005 08/16/17 0841  AST 27 21  ALT 17 15  ALKPHOS 71 63  BILITOT 0.6 0.6  PROT 7.7 6.2*  ALBUMIN 4.4 3.6   Recent Labs  Lab 08/15/17 2005  LIPASE 27   No results for input(s): AMMONIA in the last 168 hours. CBC: Recent Labs  Lab 08/15/17 2005 08/16/17 0841  WBC 10.6* 6.2  HGB 11.4* 9.9*  HCT 35.7* 31.2*  MCV 86.9 86.4  PLT 254 192   Cardiac Enzymes: No results for input(s): CKTOTAL, CKMB, CKMBINDEX, TROPONINI in the last 168 hours. BNP: Invalid input(s): POCBNP CBG: No results for input(s): GLUCAP in the last 168 hours. D-Dimer No results for input(s): DDIMER in the last 72 hours. Hgb A1c No results for input(s): HGBA1C in the last 72 hours. Lipid Profile No results for input(s): CHOL, HDL, LDLCALC, TRIG, CHOLHDL, LDLDIRECT in the last 72 hours. Thyroid function studies Recent Labs    08/16/17 0841  TSH 4.476   Anemia work up Recent Labs    08/16/17 0830 08/16/17 0841  VITAMINB12 597  --   FOLATE 15.6  --   FERRITIN 19  --   TIBC 336  --   IRON 52  --   RETICCTPCT  --  1.0   Urinalysis    Component Value Date/Time   COLORURINE YELLOW 08/15/2017 1953   APPEARANCEUR CLEAR 08/15/2017 1953   LABSPEC 1.014 08/15/2017 1953   PHURINE 7.0 08/15/2017 1953   GLUCOSEU NEGATIVE 08/15/2017 1953   HGBUR LARGE (A) 08/15/2017 1953   BILIRUBINUR NEGATIVE 08/15/2017 1953   KETONESUR NEGATIVE 08/15/2017 1953   PROTEINUR NEGATIVE 08/15/2017 1953   UROBILINOGEN 0.2 02/15/2017 1013   NITRITE NEGATIVE 08/15/2017 1953   LEUKOCYTESUR  NEGATIVE 08/15/2017 1953   Sepsis Labs Invalid input(s): PROCALCITONIN,  WBC,  LACTICIDVEN Microbiology No results found for this or any previous visit (from the past 240 hour(s)).   Time coordinating discharge: 67  SIGNED:   Dron Tanna Furry, MD  [facility] 08/16/2017, 1:05 PM  If 7PM-7AM, please contact night-coverage [email or web address] Password [email or web address]

## 2017-08-16 NOTE — H&P (Addendum)
Patricia Wise ESP:[id number] DOB: [birth year] DOA: 08/15/2017     PCP: Patient, No Pcp Per  She Was supposed to Go to [facility] tomorrow to establish care.  Outpatient Specialists: none Patient coming from:  home Lives   With family   Chief Complaint: abdominal pain   HPI: Patricia Wise is a 36 y.o. female with medical history significant of gastric bypass ( Roux-en-Y procedure), iron deficiency anemia   Presented with progressive abdominal pain has been getting worse over past few months but now have become severe every time she tries to eat even drinking some fluids today cost severe pain. She has not had any nausea or vomiting and denies any melena no weight loss. Patient try to stop eating but she still very uncomfortable. No also she fevers or chills or chest pain. No syncope. No diarrhea.     Regarding pertinent Chronic problems: gastric bypass ( Roux-en-Y procedure)  5 years ago in [place]. 2 years ago moved to [place] have not established care with GI here She has been told in the past she has iron deficiency anemia has been taking occasional Multivitamin for it.  .   IN ER:  Temp (24hrs), Avg:98.2 F (36.8 C), Min:98.2 F (36.8 C), Max:98.2 F (36.8 C)      on arrival  ED Triage Vitals  Enc Vitals Group     BP 08/15/17 1950 125/87     Pulse Rate 08/15/17 1950 82     Resp 08/15/17 1950 18     Temp 08/15/17 1950 98.2 F (36.8 C)     Temp Source 08/15/17 1950 Oral     SpO2 08/15/17 1950 100 %     Weight 08/15/17 1951 210 lb (95.3 kg)     Height 08/15/17 1951 5\' 6"  (1.676 m)     Head Circumference --      Peak Flow --      Pain Score 08/15/17 1950 6     Pain Loc --      Pain Edu? --      Excl. in [place]? --     Latest RR 17100% Hr 63 BP 119/86 Lipase <27 Na 137K 3. 4 WBC 10.6 Hg 11.4PLT 254 CT abd: Worrisome for nonperforated ulcer and possible gastric stricture cholelithiasis but no cholecystitis Following Medications were ordered  in ER: Medications  pantoprazole (PROTONIX) 80 mg in sodium chloride 0.9 % 100 mL IVPB (not administered)  pantoprazole (PROTONIX) 80 mg in sodium chloride 0.9 % 250 mL (0.32 mg/mL) infusion (not administered)  morphine 4 MG/ML injection 6 mg (6 mg Intravenous Given 08/15/17 2123)  iopamidol (ISOVUE-300) 61 % injection 30 mL (30 mLs Oral Contrast Given 08/15/17 2138)  iopamidol (ISOVUE-300) 61 % injection 100 mL (100 mLs Intravenous Contrast Given 08/15/17 2232)  pantoprazole (PROTONIX) injection 40 mg (40 mg Intravenous Given 08/15/17 2359)     Hospitalist was called for admission for severe abdominal pain with possible gastric ulcer  Review of Systems:    Pertinent positives include:  abdominal pain,  Constitutional:  No weight loss, night sweats, Fevers, chills, fatigue, weight loss  HEENT:  No headaches, Difficulty swallowing,Tooth/dental problems,Sore throat,  No sneezing, itching, ear ache, nasal congestion, post nasal drip,  Cardio-vascular:  No chest pain, Orthopnea, PND, anasarca, dizziness, palpitations.no Bilateral lower extremity swelling  GI:  No heartburn, indigestion, nausea, vomiting, diarrhea, change in bowel habits, loss of appetite, melena, blood in stool, hematemesis Resp:  no shortness of breath at rest. No  dyspnea on exertion, No excess mucus, no productive cough, No non-productive cough, No coughing up of blood.No change in color of mucus.No wheezing. Skin:  no rash or lesions. No jaundice GU:  no dysuria, change in color of urine, no urgency or frequency. No straining to urinate.  No flank pain.  Musculoskeletal:  No joint pain or no joint swelling. No decreased range of motion. No back pain.  Psych:  No change in mood or affect. No depression or anxiety. No memory loss.  Neuro: no localizing neurological complaints, no tingling, no weakness, no double vision, no gait abnormality, no slurred speech, no confusion  As per HPI otherwise 10 point review of  systems negative.   Past Medical History: Past Medical History:  Diagnosis Date  . mrsa   . Preterm labor    07/2007  . Vaginal Pap smear, abnormal    Past Surgical History:  Procedure Laterality Date  . BREAST SURGERY     augmentation  . CESAREAN SECTION    . DILATION AND CURETTAGE OF UTERUS    . GASTRIC BYPASS    . TONSILLECTOMY    . WRIST SURGERY Bilateral    carpal tunnel     Social History:  Ambulatory  independently     reports that she quit smoking about 5 years ago. she has never used smokeless tobacco. She reports that she drinks alcohol. She reports that she does not use drugs.  Allergies:  No Known Allergies     Family History:   Family History  Problem Relation Age of Onset  . Cancer Mother     Medications: Prior to Admission medications   Medication Sig Start Date End Date Taking? Authorizing Provider  ferrous sulfate (FERROUSUL) 325 (65 FE) MG tablet Take 1 tablet (325 mg total) by mouth 2 (two) times daily. Patient not taking: Reported on 03/17/2017 02/27/17   Elvera Maria, CNM  HYDROcodone-acetaminophen (NORCO/VICODIN) 5-325 MG tablet Take 1-2 tablets by mouth every 6 (six) hours as needed. Patient not taking: Reported on 03/17/2017 02/27/17   Elvera Maria, CNM    Physical Exam: Patient Vitals for the past 24 hrs:  BP Temp Temp src Pulse Resp SpO2 Height Weight  08/15/17 2253 119/86 - - 63 17 100 % - -  08/15/17 1951 - - - - - - 5\' 6"  (1.676 m) 95.3 kg (210 lb)  08/15/17 1950 125/87 98.2 F (36.8 C) Oral 82 18 100 % - -    1. General:  in No Acute distress  well -appearing 2. Psychological: Alert and   Oriented 3. Head/ENT:    Dry Mucous Membranes                          Head Non traumatic, neck supple                          Normal  Dentition 4. SKIN:  decreased Skin turgor,  Skin clean Dry and intact no rash 5. Heart: Regular rate and rhythm no Murmur, no Rub or gallop 6. Lungs:  Clear to auscultation bilaterally,  no wheezes or crackles   7. Abdomen: Soft,  LEFT Upper qudarat tenderness, Non distended   obese  bowel sounds present 8. Lower extremities: no clubbing, cyanosis, or edema 9. Neurologically Grossly intact, moving all 4 extremities equally   10. MSK: Normal range of motion   body mass index is 33.89 kg/m.  Labs  on Admission:   Labs on Admission: I have personally reviewed following labs and imaging studies  CBC: Recent Labs  Lab 08/15/17 2005  WBC 10.6*  HGB 11.4*  HCT 35.7*  MCV 86.9  PLT 557   Basic Metabolic Panel: Recent Labs  Lab 08/15/17 2005  NA 137  K 3.4*  CL 105  CO2 26  GLUCOSE 120*  BUN 13  CREATININE 0.79  CALCIUM 9.1   GFR: Estimated Creatinine Clearance: 114.2 mL/min (by C-G formula based on SCr of 0.79 mg/dL). Liver Function Tests: Recent Labs  Lab 08/15/17 2005  AST 27  ALT 17  ALKPHOS 71  BILITOT 0.6  PROT 7.7  ALBUMIN 4.4   Recent Labs  Lab 08/15/17 2005  LIPASE 27   No results for input(s): AMMONIA in the last 168 hours. Coagulation Profile: No results for input(s): INR, PROTIME in the last 168 hours. Cardiac Enzymes: No results for input(s): CKTOTAL, CKMB, CKMBINDEX, TROPONINI in the last 168 hours. BNP (last 3 results) No results for input(s): PROBNP in the last 8760 hours. HbA1C: No results for input(s): HGBA1C in the last 72 hours. CBG: No results for input(s): GLUCAP in the last 168 hours. Lipid Profile: No results for input(s): CHOL, HDL, LDLCALC, TRIG, CHOLHDL, LDLDIRECT in the last 72 hours. Thyroid Function Tests: No results for input(s): TSH, T4TOTAL, FREET4, T3FREE, THYROIDAB in the last 72 hours. Anemia Panel: No results for input(s): VITAMINB12, FOLATE, FERRITIN, TIBC, IRON, RETICCTPCT in the last 72 hours. Urine analysis:    Component Value Date/Time   COLORURINE YELLOW 08/15/2017 1953   APPEARANCEUR CLEAR 08/15/2017 1953   LABSPEC 1.014 08/15/2017 1953   PHURINE 7.0 08/15/2017 1953   GLUCOSEU NEGATIVE  08/15/2017 1953   HGBUR LARGE (A) 08/15/2017 1953   BILIRUBINUR NEGATIVE 08/15/2017 1953   KETONESUR NEGATIVE 08/15/2017 1953   PROTEINUR NEGATIVE 08/15/2017 1953   UROBILINOGEN 0.2 02/15/2017 1013   NITRITE NEGATIVE 08/15/2017 1953   LEUKOCYTESUR NEGATIVE 08/15/2017 1953   Sepsis Labs: @LABRCNTIP (procalcitonin:4,lacticidven:4) )No results found for this or any previous visit (from the past 240 hour(s)).    UA no evidence of UTI    No results found for: HGBA1C  Estimated Creatinine Clearance: 114.2 mL/min (by C-G formula based on SCr of 0.79 mg/dL).  BNP (last 3 results) No results for input(s): PROBNP in the last 8760 hours.   ECG REPORT Not obtained  [facility] Weights   08/15/17 1951  Weight: 95.3 kg (210 lb)     Cultures:    Component Value Date/Time   SDES URINE, CLEAN CATCH 08/25/2015 0940   SPECREQUEST NONE 08/25/2015 0940   CULT  08/25/2015 0940    6,000 COLONIES/mL INSIGNIFICANT GROWTH Performed at [facility] 08/26/2015 FINAL 08/25/2015 0940     Radiological Exams on Admission: Ct Abdomen Pelvis W Contrast  Result Date: 08/15/2017 CLINICAL DATA:  Abdominal pain after drinking water and solid foods. History of gastric bypass 5 years ago. EXAM: CT ABDOMEN AND PELVIS WITH CONTRAST TECHNIQUE: Multidetector CT imaging of the abdomen and pelvis was performed using the standard protocol following bolus administration of intravenous contrast. CONTRAST:  15mL ISOVUE-300 IOPAMIDOL (ISOVUE-300) INJECTION 61% COMPARISON:  None. FINDINGS: LOWER CHEST: Lung bases are clear. Included heart size is normal. No pericardial effusion. HEPATOBILIARY: 17 mm gallstone in gallbladder neck without CT findings of acute cholecystitis. Trace periportal edema. Mild hepatomegaly, 22 cm in cranial caudad dimension. PANCREAS: Normal. SPLEEN: Normal. ADRENALS/URINARY TRACT: Kidneys are orthotopic, demonstrating symmetric enhancement. No nephrolithiasis,  hydronephrosis or  solid renal masses. The unopacified ureters are normal in course and caliber. Urinary bladder is partially distended and unremarkable. Normal adrenal glands. STOMACH/BOWEL: Status post gastrojejunostomy. Focal out pouching at gastric anastomosis with surrounding inflammatory changes (axial 20/91). Focal narrowing of distal stomach proximal to area of outpouching (coronal 52/127). Enteric contrast has not yet reached the distal small bowel. Normal appendix. VASCULAR/LYMPHATIC: Aortoiliac vessels are normal in course and caliber. No lymphadenopathy by CT size criteria. REPRODUCTIVE: Normal. OTHER: No intraperitoneal free fluid or free air. MUSCULOSKELETAL: Nonacute.  Status post bilateral breast implants. IMPRESSION: 1. Status post gastric bypass. Distal stomach focal outpouching with extensive inflammation concerning for non perforated ulcer. In addition, possible gastric stricture. Anatomy would be better evaluated on upper GI versus endoscopy. 2. Cholelithiasis without CT findings of acute cholecystitis. 3. Mild hepatomegaly. 4. Acute findings discussed with and reconfirmed by Dr.KEVIN CAMPOS on 08/15/2017 at 11:29 pm. Electronically Signed   By: Elon Alas M.D.   On: 08/15/2017 23:31    Chart has been reviewed    Assessment/Plan  36 y.o. female with medical history significant of gastric bypass ( Roux-en-Y procedure), iron deficiency anemia  Admitted for severe abdominal pain with likely non-perforated gastric ulcer and possible peptic outlet obstruction  Present on Admission:  . Continuous severe abdominal pain - in a setting of possible peptic ulcer/peptic outlet obstruction will need GI consult in the morning for now continue Protonix IV was likely benefit from endoscopy during this admission . Hypokalemia - - will replace and repeat in AM,  check magnesium level and replace as needed Dehydration  - will rehydrate Anemia - will obtain anemia panel in AM Other plan as per orders.  DVT  prophylaxis:  SCD    Code Status:  FULL CODE as per patient   Family Communication:   Family not  at  Bedside   Disposition Plan:    To home once workup is complete and patient is stable                                    Consults called: NONE  Admission status:   obs   Level of care       medical floor        I have spent a total of 56 min on this admission   Patricia Wise 08/16/2017, 1:25 AM    [facility]  Pager [phone]   after 2 AM please page floor coverage PA If 7AM-7PM, please contact the day team taking care of the patient  [email or web address]  Password [email or web address]

## 2017-08-16 NOTE — Progress Notes (Signed)
No charge note: Patient was seen and examined at bedside.  She was admitted after midnight.  Please see H&P for detail. 36 year old female with no significant past medical history except morbid obesity required gastric bypass in 2013 in [place], chronic iron deficiency anemia presented with progressively worsening epigastric abdominal pain associated with nausea and vomiting.  She has intermittent pain for about a month but last 3-4 days the pain worsened to the point that she could not eat well.  The CT scan of abdomen pelvis in the ER consistent with nonperforated gastric ulcer and possible gastric stricture.  Patient was kept n.p.o., started on IV fluids and IV Protonix and admitted for further evaluation.   -Continue n.p.o., change IV fluid with dextrose and potassium chloride. -GI consult requested -Follow-up CBC, iron studies, folic acid and vitamin B12 which were ordered  on admission  DVT prophylaxis with SCD and early ambulation.

## 2017-08-16 NOTE — Anesthesia Postprocedure Evaluation (Signed)
Anesthesia Post Note  Patient: Patricia Wise  Procedure(s) Performed: ESOPHAGOGASTRODUODENOSCOPY (EGD) WITH PROPOFOL (N/A )     Patient location during evaluation: Endoscopy Anesthesia Type: MAC Level of consciousness: awake and alert, awake and oriented Pain management: pain level controlled Vital Signs Assessment: post-procedure vital signs reviewed and stable Respiratory status: spontaneous breathing, nonlabored ventilation and respiratory function stable Cardiovascular status: blood pressure returned to baseline and stable Postop Assessment: no apparent nausea or vomiting Anesthetic complications: no    Last Vitals:  Vitals:   08/16/17 1200 08/16/17 1332  BP: 103/73 105/63  Pulse: 89 (!) 54  Resp: 20 18  Temp:  36.7 C  SpO2: 100% 100%    Last Pain:  Vitals:   08/16/17 1332  TempSrc: Oral  PainSc:                  Rollan Roger,E. Vieva Brummitt

## 2017-08-16 NOTE — Consult Note (Signed)
Referring Provider: Dr. Carolin Sicks Primary Care Physician:  Patient, No Pcp Per Primary Gastroenterologist:  Althia Forts  Reason for Consultation:  Epigastric pain and abnormal CT scan  HPI: Patricia Wise is a 36 y.o. female with medical history significant of gastric bypass (Roux-en-Y procedure in [place] 5 years ago), iron deficiency anemia.   Reports upper abdominal discomfort, bloating, fullness along with nausea and intermittent vomiting for the past several months.  Acutely she started having severe upper abdominal pain on Sunday.  Took a drink of water, which was very painful and then the pain became constant and severe.  Denies NSAID use.  Takes only occasional chewable multivitamin prn.  CT scan of the abdomen and pelvis with contrast showed the following:  IMPRESSION: 1. Status post gastric bypass. Distal stomach focal outpouching with extensive inflammation concerning for non perforated ulcer. In addition, possible gastric stricture. Anatomy would be better evaluated on upper GI versus endoscopy. 2. Cholelithiasis without CT findings of acute cholecystitis. 3. Mild hepatomegaly.  Was placed NPO and started on PPI gtt.  Past Medical History:  Diagnosis Date  . mrsa   . Preterm labor    07/2007  . Vaginal Pap smear, abnormal     Past Surgical History:  Procedure Laterality Date  . BREAST SURGERY     augmentation  . CESAREAN SECTION    . DILATION AND CURETTAGE OF UTERUS    . GASTRIC BYPASS    . TONSILLECTOMY    . WRIST SURGERY Bilateral    carpal tunnel    Prior to Admission medications   Medication Sig Start Date End Date Taking? Authorizing Provider  ferrous sulfate (FERROUSUL) 325 (65 FE) MG tablet Take 1 tablet (325 mg total) by mouth 2 (two) times daily. Patient not taking: Reported on 03/17/2017 02/27/17   Elvera Maria, CNM  HYDROcodone-acetaminophen (NORCO/VICODIN) 5-325 MG tablet Take 1-2 tablets by mouth every 6 (six) hours as needed. Patient not  taking: Reported on 03/17/2017 02/27/17   Elvera Maria, CNM    Current Facility-Administered Medications  Medication Dose Route Frequency Provider Last Rate Last Dose  . 0.9 %  sodium chloride infusion   Intravenous Continuous Toy Baker, MD 125 mL/hr at 08/16/17 0815    . acetaminophen (TYLENOL) tablet 650 mg  650 mg Oral Q6H PRN Toy Baker, MD       Or  . acetaminophen (TYLENOL) suppository 650 mg  650 mg Rectal Q6H PRN Doutova, Anastassia, MD      . morphine 4 MG/ML injection 2-4 mg  2-4 mg Intravenous Q4H PRN Toy Baker, MD   2 mg at 08/16/17 0815  . ondansetron (ZOFRAN) tablet 4 mg  4 mg Oral Q6H PRN Toy Baker, MD       Or  . ondansetron (ZOFRAN) injection 4 mg  4 mg Intravenous Q6H PRN Doutova, Anastassia, MD      . pantoprazole (PROTONIX) 80 mg in sodium chloride 0.9 % 250 mL (0.32 mg/mL) infusion  8 mg/hr Intravenous Continuous Jola Schmidt, MD 25 mL/hr at 08/16/17 0117 8 mg/hr at 08/16/17 0117    Allergies as of 08/15/2017  . (No Known Allergies)    Family History  Problem Relation Age of Onset  . Cancer Mother     Social History   Socioeconomic History  . Marital status: Single    Spouse name: Not on file  . Number of children: Not on file  . Years of education: Not on file  . Highest education level: Not on file  Social Needs  . Financial resource strain: Not on file  . Food insecurity - worry: Not on file  . Food insecurity - inability: Not on file  . Transportation needs - medical: Not on file  . Transportation needs - non-medical: Not on file  Occupational History  . Not on file  Tobacco Use  . Smoking status: Former Smoker    Last attempt to quit: 08/04/2012    Years since quitting: 5.0  . Smokeless tobacco: Never Used  Substance and Sexual Activity  . Alcohol use: Yes    Comment: OCCASIONAL  . Drug use: No  . Sexual activity: Yes    Birth control/protection: None  Other Topics Concern  . Not on file    Social History Narrative  . Not on file    Review of Systems: ROS negative except as mentioned in HPI.  Physical Exam: Vital signs in last 24 hours: Temp:  [98.1 F (36.7 C)-98.2 F (36.8 C)] 98.1 F (36.7 C) (11/13 0244) Pulse Rate:  [63-82] 78 (11/13 0244) Resp:  [16-18] 16 (11/13 0244) BP: (113-125)/(78-87) 113/78 (11/13 0244) SpO2:  [100 %] 100 % (11/13 0244) Weight:  [210 lb (95.3 kg)] 210 lb (95.3 kg) (11/12 1951) Last BM Date: 08/15/17 General:  Alert, Well-developed, well-nourished, pleasant and cooperative in NAD Head:  Normocephalic and atraumatic. Eyes:  Sclera clear, no icterus.  Conjunctiva pink. Ears:  Normal auditory acuity. Mouth:  No deformity or lesions.   Lungs:  Clear throughout to auscultation.  No wheezes, crackles, or rhonchi.  Heart:  Regular rate and rhythm; no murmurs, clicks, rubs, or gallops. Abdomen:  Soft, non-distended.  BS present.  Epigastric TTP. Msk:  Symmetrical without gross deformities.  Pulses:  Normal pulses noted. Extremities:  Without clubbing or edema. Neurologic:  Alert and oriented x 4;  grossly normal neurologically. Skin:  Intact without significant lesions or rashes. Psych:  Alert and cooperative. Normal mood and affect.  Intake/Output from previous day: 11/12 0701 - 11/13 0700 In: 300 [I.V.:100; IV Piggyback:200] Out: -   Lab Results: Recent Labs    08/15/17 2005 08/16/17 0841  WBC 10.6* 6.2  HGB 11.4* 9.9*  HCT 35.7* 31.2*  PLT 254 192   BMET Recent Labs    08/15/17 2005 08/16/17 0841  NA 137 137  K 3.4* 3.7  CL 105 106  CO2 26 25  GLUCOSE 120* 100*  BUN 13 10  CREATININE 0.79 0.71  CALCIUM 9.1 8.6*   LFT Recent Labs    08/16/17 0841  PROT 6.2*  ALBUMIN 3.6  AST 21  ALT 15  ALKPHOS 63  BILITOT 0.6   Studies/Results: Ct Abdomen Pelvis W Contrast  Result Date: 08/15/2017 CLINICAL DATA:  Abdominal pain after drinking water and solid foods. History of gastric bypass 5 years ago. EXAM: CT  ABDOMEN AND PELVIS WITH CONTRAST TECHNIQUE: Multidetector CT imaging of the abdomen and pelvis was performed using the standard protocol following bolus administration of intravenous contrast. CONTRAST:  141mL ISOVUE-300 IOPAMIDOL (ISOVUE-300) INJECTION 61% COMPARISON:  None. FINDINGS: LOWER CHEST: Lung bases are clear. Included heart size is normal. No pericardial effusion. HEPATOBILIARY: 17 mm gallstone in gallbladder neck without CT findings of acute cholecystitis. Trace periportal edema. Mild hepatomegaly, 22 cm in cranial caudad dimension. PANCREAS: Normal. SPLEEN: Normal. ADRENALS/URINARY TRACT: Kidneys are orthotopic, demonstrating symmetric enhancement. No nephrolithiasis, hydronephrosis or solid renal masses. The unopacified ureters are normal in course and caliber. Urinary bladder is partially distended and unremarkable. Normal adrenal glands. STOMACH/BOWEL: Status post  gastrojejunostomy. Focal out pouching at gastric anastomosis with surrounding inflammatory changes (axial 20/91). Focal narrowing of distal stomach proximal to area of outpouching (coronal 52/127). Enteric contrast has not yet reached the distal small bowel. Normal appendix. VASCULAR/LYMPHATIC: Aortoiliac vessels are normal in course and caliber. No lymphadenopathy by CT size criteria. REPRODUCTIVE: Normal. OTHER: No intraperitoneal free fluid or free air. MUSCULOSKELETAL: Nonacute.  Status post bilateral breast implants. IMPRESSION: 1. Status post gastric bypass. Distal stomach focal outpouching with extensive inflammation concerning for non perforated ulcer. In addition, possible gastric stricture. Anatomy would be better evaluated on upper GI versus endoscopy. 2. Cholelithiasis without CT findings of acute cholecystitis. 3. Mild hepatomegaly. 4. Acute findings discussed with and reconfirmed by Dr.KEVIN CAMPOS on 08/15/2017 at 11:29 pm. Electronically Signed   By: Elon Alas M.D.   On: 08/15/2017 23:31   IMPRESSION:  *36 year  old female with history of Roux-en-Y 5 years ago.  Has had post-prandial upper abdominal fullness, bloating, nausea, and intermittent vomiting for several months, but more acutely developed epigastric abdominal pain on Sunday.  CT scan shows gastric ulcer and possible stricture.  Denies NSAID use.  ? If ulcer could be from ischemia at the anastomosis, foreign body such as a retained suture, etc.  PLAN: -EGD today. -Is on PPI gtt, which I will discontinue and place her on PO BID.   Patricia Wise. Zehr  08/16/2017, 9:54 AM  Pager number [phone]  GI ATTENDING  History, laboratories, x-rays reviewed. Patient personally seen and examined. Patient presents with several month history of progressive upper abdominal discomfort and early satiety culminating in more severe abdominal pain with vomiting. CT suggest problem in the region of the gastroenteric anastomosis. She also has gallstones. Plan upper endoscopy today.The nature of the procedure, as well as the risks, benefits, and alternatives were carefully and thoroughly reviewed with the patient. Ample time for discussion and questions allowed. The patient understood, was satisfied, and agreed to proceed.  Patricia Wise. Patricia Wise., M.D. [facility] Division of Gastroenterology

## 2017-08-16 NOTE — Op Note (Signed)
[facility] Patient Name: Patricia Wise Procedure Date: 08/16/2017 MRN: [id number] Attending MD: Docia Chuck. Henrene Pastor , MD Date of Birth: 09/14/1981 CSN: [id number] Age: 36 Admit Type: Outpatient Procedure:                Upper GI endoscopy Indications:              Epigastric abdominal pain, Nausea with vomiting Providers:                Docia Chuck. Henrene Pastor, MD, Burtis Junes, RN, Janie Billups,                            Technician, Courtney Heys. [place], CRNA Referring MD:              Medicines:                Monitored Anesthesia Care Complications:            No immediate complications. Estimated Blood Loss:     Estimated blood loss: none. Procedure:                Pre-Anesthesia Assessment:                           - Prior to the procedure, a History and Physical                            was performed, and patient medications and                            allergies were reviewed. The patient's tolerance of                            previous anesthesia was also reviewed. The risks                            and benefits of the procedure and the sedation                            options and risks were discussed with the patient.                            All questions were answered, and informed consent                            was obtained. Prior Anticoagulants: The patient has                            taken no previous anticoagulant or antiplatelet                            agents. ASA Grade Assessment: II - A patient with                            mild systemic disease. After reviewing the risks  and benefits, the patient was deemed in                            satisfactory condition to undergo the procedure.                           After obtaining informed consent, the endoscope was                            passed under direct vision. Throughout the                            procedure, the patient's blood pressure, pulse, and                          oxygen saturations were monitored continuously. The                            Endoscope was introduced through the mouth, and                            advanced to the jejunum. The upper GI endoscopy was                            accomplished without difficulty. The patient                            tolerated the procedure well. Scope In: Scope Out: Findings:      The esophagus was normal.      The stomach revealed evidence of prior bypass surgery. The gastric pouch       proper was approximately 5 cm. There were no abnormalities within the       pouch proper.      The anastomosis was patent and the endoscope easily traversed this area       with deep jejunal intubation.      The distal jejunal mucosa was normal.      One non-bleeding cratered ulcer with no stigmata of bleeding was found       just distal to the gastrojejunal anastomosis. The lesion was 10 mm in       largest dimension. Impression:               1. Status post Roux-en-Y gastric bypass surgery                           2. Large ulcer just distal to the gastrojejunal                            anastomosis as described. Moderate Sedation:      none Recommendation:           1. Pantoprazole 40 mg PACKET twice daily emptied                            into applesauce. Primary service please prescribe  2. Avoid NSAIDs and aspirin as you have been doing.                           3. Diet as tolerated                           4. Outpatient GI follow-up in 4 weeks. We will                            arrange                           5. Will need repeat EGD in about 8 weeks to assess                            for ulcer healing                           OKAY for discharge home today. Discussed with                            patient. Copy of report given the patient. Procedure Code(s):        --- Professional ---                           [phone], Esophagogastroduodenoscopy,  flexible,                            transoral; diagnostic, including collection of                            specimen(s) by brushing or washing, when performed                            (separate procedure) Diagnosis Code(s):        --- Professional ---                           K28.9, Gastrojejunal ulcer, unspecified as acute or                            chronic, without hemorrhage or perforation                           R10.13, Epigastric pain                           R11.2, Nausea with vomiting, unspecified CPT copyright 2016 American Medical Association. All rights reserved. The codes documented in this report are preliminary and upon coder review may  be revised to meet current compliance requirements. Docia Chuck. Henrene Pastor, MD 08/16/2017 11:56:54 AM This report has been signed electronically. Number of Addenda: 0

## 2017-08-16 NOTE — ED Notes (Signed)
Report given to [facility] on 6E

## 2017-08-18 ENCOUNTER — Encounter (HOSPITAL_COMMUNITY): Payer: Self-pay | Admitting: Internal Medicine

## 2017-09-14 ENCOUNTER — Ambulatory Visit: Payer: Medicaid Other | Admitting: Gastroenterology

## 2017-10-12 ENCOUNTER — Ambulatory Visit: Payer: Medicaid Other | Admitting: *Deleted

## 2017-10-12 ENCOUNTER — Other Ambulatory Visit (HOSPITAL_COMMUNITY)
Admission: RE | Admit: 2017-10-12 | Discharge: 2017-10-12 | Disposition: A | Discharge status: Home / Self Care | Payer: Medicaid Other | Source: Ambulatory Visit | Attending: Obstetrics and Gynecology | Admitting: Obstetrics and Gynecology

## 2017-10-12 DIAGNOSIS — N898 Other specified noninflammatory disorders of vagina: Secondary | ICD-10-CM

## 2017-10-12 DIAGNOSIS — B9689 Other specified bacterial agents as the cause of diseases classified elsewhere: Secondary | ICD-10-CM | POA: Diagnosis not present

## 2017-10-12 DIAGNOSIS — N760 Acute vaginitis: Secondary | ICD-10-CM | POA: Diagnosis not present

## 2017-10-12 NOTE — Progress Notes (Signed)
States she is having excessive vaginal discharge and bad odor. Will do self swab. Prefers pills if she has bv.

## 2017-10-13 ENCOUNTER — Other Ambulatory Visit: Payer: Self-pay | Admitting: Nurse Practitioner

## 2017-10-13 LAB — CERVICOVAGINAL ANCILLARY ONLY
Bacterial vaginitis: POSITIVE — AB
Candida vaginitis: NEGATIVE
Chlamydia: NEGATIVE
NEISSERIA GONORRHEA: NEGATIVE
Trichomonas: NEGATIVE

## 2017-10-13 MED ORDER — METRONIDAZOLE 500 MG PO TABS: 500.0000 mg | ORAL_TABLET | Freq: Two times a day (BID) | ORAL | 0 refills | Status: DC

## 2017-10-13 NOTE — Progress Notes (Signed)
Lab results reviewed.  Medication sent to her pharmacy.  MyChart note sent to the client.    Earlie Server, RN, MSN, NP-BC Nurse Practitioner, [facility] for [employer], [facility] 10/13/2017 3:51 PM

## 2017-11-04 ENCOUNTER — Ambulatory Visit: Payer: Medicaid Other | Admitting: Obstetrics and Gynecology

## 2017-11-20 ENCOUNTER — Other Ambulatory Visit: Payer: Self-pay

## 2017-11-20 ENCOUNTER — Emergency Department (HOSPITAL_COMMUNITY): Payer: Medicaid Other

## 2017-11-20 ENCOUNTER — Inpatient Hospital Stay (HOSPITAL_COMMUNITY)
Admission: EM | Admit: 2017-11-20 | Discharge: 2017-11-22 | DRG: 382 | Disposition: A | Discharge status: Home / Self Care | Payer: Medicaid Other | Attending: Internal Medicine | Admitting: Internal Medicine

## 2017-11-20 ENCOUNTER — Encounter (HOSPITAL_COMMUNITY): Payer: Self-pay

## 2017-11-20 DIAGNOSIS — Z8711 Personal history of peptic ulcer disease: Secondary | ICD-10-CM

## 2017-11-20 DIAGNOSIS — K8020 Calculus of gallbladder without cholecystitis without obstruction: Secondary | ICD-10-CM | POA: Diagnosis present

## 2017-11-20 DIAGNOSIS — K279 Peptic ulcer, site unspecified, unspecified as acute or chronic, without hemorrhage or perforation: Secondary | ICD-10-CM | POA: Diagnosis present

## 2017-11-20 DIAGNOSIS — Z87891 Personal history of nicotine dependence: Secondary | ICD-10-CM

## 2017-11-20 DIAGNOSIS — R1013 Epigastric pain: Secondary | ICD-10-CM

## 2017-11-20 DIAGNOSIS — R109 Unspecified abdominal pain: Secondary | ICD-10-CM | POA: Diagnosis present

## 2017-11-20 DIAGNOSIS — K283 Acute gastrojejunal ulcer without hemorrhage or perforation: Principal | ICD-10-CM | POA: Diagnosis present

## 2017-11-20 DIAGNOSIS — D509 Iron deficiency anemia, unspecified: Secondary | ICD-10-CM | POA: Diagnosis present

## 2017-11-20 DIAGNOSIS — Z9884 Bariatric surgery status: Secondary | ICD-10-CM

## 2017-11-20 DIAGNOSIS — K760 Fatty (change of) liver, not elsewhere classified: Secondary | ICD-10-CM | POA: Diagnosis present

## 2017-11-20 DIAGNOSIS — K289 Gastrojejunal ulcer, unspecified as acute or chronic, without hemorrhage or perforation: Secondary | ICD-10-CM | POA: Diagnosis present

## 2017-11-20 DIAGNOSIS — Z79899 Other long term (current) drug therapy: Secondary | ICD-10-CM

## 2017-11-20 LAB — CBC
HCT: 34.9 % — ABNORMAL LOW (ref 36.0–46.0)
Hemoglobin: 11.2 g/dL — ABNORMAL LOW (ref 12.0–15.0)
MCH: 27.5 pg (ref 26.0–34.0)
MCHC: 32.1 g/dL (ref 30.0–36.0)
MCV: 85.5 fL (ref 78.0–100.0)
PLATELETS: 337 10*3/uL (ref 150–400)
RBC: 4.08 MIL/uL (ref 3.87–5.11)
RDW: 15 % (ref 11.5–15.5)
WBC: 12.6 10*3/uL — ABNORMAL HIGH (ref 4.0–10.5)

## 2017-11-20 LAB — URINALYSIS, ROUTINE W REFLEX MICROSCOPIC
BILIRUBIN URINE: NEGATIVE
Glucose, UA: NEGATIVE mg/dL
HGB URINE DIPSTICK: NEGATIVE
Ketones, ur: NEGATIVE mg/dL
Leukocytes, UA: NEGATIVE
NITRITE: NEGATIVE
PROTEIN: NEGATIVE mg/dL
Specific Gravity, Urine: 1.016 (ref 1.005–1.030)
pH: 7 (ref 5.0–8.0)

## 2017-11-20 LAB — COMPREHENSIVE METABOLIC PANEL
ALK PHOS: 49 U/L (ref 38–126)
ALT: 18 U/L (ref 14–54)
AST: 33 U/L (ref 15–41)
Albumin: 3.7 g/dL (ref 3.5–5.0)
Anion gap: 7 (ref 5–15)
BILIRUBIN TOTAL: 0.3 mg/dL (ref 0.3–1.2)
BUN: 11 mg/dL (ref 6–20)
CALCIUM: 8.9 mg/dL (ref 8.9–10.3)
CO2: 28 mmol/L (ref 22–32)
CREATININE: 0.78 mg/dL (ref 0.44–1.00)
Chloride: 106 mmol/L (ref 101–111)
GFR calc Af Amer: >60 mL/min (ref >60)
GFR calc non Af Amer: >60 mL/min (ref >60)
Glucose, Bld: 108 mg/dL — ABNORMAL HIGH (ref 65–99)
Potassium: 4.4 mmol/L (ref 3.5–5.1)
SODIUM: 141 mmol/L (ref 135–145)
TOTAL PROTEIN: 6.3 g/dL — AB (ref 6.5–8.1)

## 2017-11-20 LAB — I-STAT BETA HCG BLOOD, ED (MC, WL, AP ONLY)

## 2017-11-20 LAB — LIPASE, BLOOD: Lipase: 28 U/L (ref 11–51)

## 2017-11-20 MED ORDER — PANTOPRAZOLE SODIUM 40 MG IV SOLR
20.0000 mg | Freq: Once | INTRAVENOUS | Status: AC
  Administered 2017-11-20: 20 mg via INTRAVENOUS
  Filled 2017-11-20: qty 40

## 2017-11-20 MED ORDER — HYDROMORPHONE HCL 1 MG/ML IJ SOLN
1.0000 mg | Freq: Once | INTRAMUSCULAR | Status: AC
  Administered 2017-11-20: 1 mg via INTRAVENOUS
  Filled 2017-11-20: qty 1

## 2017-11-20 MED ORDER — MORPHINE SULFATE (PF) 4 MG/ML IV SOLN
4.0000 mg | Freq: Once | INTRAVENOUS | Status: AC
  Administered 2017-11-20: 4 mg via INTRAVENOUS
  Filled 2017-11-20: qty 1

## 2017-11-20 MED ORDER — SODIUM CHLORIDE 0.9 % IV BOLUS (SEPSIS)
1000.0000 mL | Freq: Once | INTRAVENOUS | Status: AC
  Administered 2017-11-20: 1000 mL via INTRAVENOUS

## 2017-11-20 MED ORDER — IOPAMIDOL (ISOVUE-300) INJECTION 61%
INTRAVENOUS | Status: AC
  Administered 2017-11-20: 100 mL via INTRAVENOUS
  Filled 2017-11-20: qty 100

## 2017-11-20 NOTE — ED Provider Notes (Signed)
[facility] DEPT Provider Note   CSN: [id number] Arrival date & time: 11/20/17  1434     History   Chief Complaint Chief Complaint  Patient presents with  . Abdominal Pain    HPI Patricia Wise is a 37 y.o. female history of Roux-en-Y gastric bypass who presents for 4 days of progressively worsening epigastric abdominal pain.  Patient reports that pain initially started out 4 days ago and was.  She describes it as a constant, "sharp pain."  She states that the pain is worsened by eating.  She denies any other alleviating or aggravating factors. She reports that she has not been able to tolerate PO in the last day secondary to pain.  She has not taken any medication for the pain.  Patient had a similar episode of pain in November 2018.  At the time she was seen in the ED and there is concern about peptic ulcer.  She had an EGD done at that time during admission which saw distal stomach focal outpouching with extensive inflammation concerning for non-perforated ulcer.  There is also concern about possible gastric stricture..  At time of discharge, patient was instructed to follow-up with outpatient GI.  Patient was also given Protonix but states that she never followed up with GI did not take the medication.  She states that she had been doing well until today symptoms.  Patient is able to tolerate p.o. but does report worsening pain.  She denies any fever, chest pain, difficulty breathing, nausea/vomiting, dysuria, hematuria.  The history is provided by the patient.    Past Medical History:  Diagnosis Date  . BV (bacterial vaginosis) 07/2015   also candidial vaginosis 03/2017  . Fatty liver 05/2017   noted along with gallstone in neck of GB on CT 08/15/17, LFTs normal at that time.   . Morbid obesity ([place]) 2014   s/p gastric bypass in [place].    Herold Harms   . Normocytic anemia 02/2017  . Preterm labor    07/2007  . Vaginal Pap smear, abnormal      Patient Active Problem List   Diagnosis Date Noted  . PUD (peptic ulcer disease) 11/21/2017  . Marginal ulcer   . Hypokalemia 08/16/2017  . Continuous severe abdominal pain 08/16/2017  . Dehydration 08/16/2017  . Abdominal pain 08/16/2017  . Jejunal ulcer   . Missed abortion 02/15/2017  . History of C-section 06/24/2015  . Abnormal Pap smear of cervix 06/16/2015  . Chlamydia 06/16/2015  . Thyroid disease 06/16/2015  . H/O gastric bypass 06/16/2015    Past Surgical History:  Procedure Laterality Date  . BREAST SURGERY     augmentation  . CESAREAN SECTION    . CESAREAN SECTION N/A 08/14/2015    REPEAT CESAREAN SECTION;  Surgeon: Donnamae Jude, MD;   . DILATION AND CURETTAGE OF UTERUS    . DILATION AND EVACUATION N/A 02/18/2017   for missed Ab.   . ESOPHAGOGASTRODUODENOSCOPY (EGD) WITH PROPOFOL N/A 08/16/2017   Procedure: ESOPHAGOGASTRODUODENOSCOPY (EGD) WITH PROPOFOL;  Surgeon: Irene Shipper, MD;  Location: [facility] ENDOSCOPY;  Service: Endoscopy;  Laterality: N/A;  . GASTRIC BYPASS  2013 vs 2014   in [place]  . TONSILLECTOMY    . WRIST SURGERY Bilateral    carpal tunnel    OB History    Gravida Para Term Preterm AB Living   3 2 1 1 1 2    SAB TAB Ectopic Multiple Live Births   1  0 2       Home Medications    Prior to Admission medications   Medication Sig Start Date End Date Taking? Authorizing Provider  acetaminophen (TYLENOL) 500 MG tablet Take 1,000 mg by mouth every 6 (six) hours as needed for mild pain, moderate pain or headache.   Yes [provider]  ferrous sulfate (FERROUSUL) 325 (65 FE) MG tablet Take 1 tablet (325 mg total) by mouth 2 (two) times daily. Patient not taking: Reported on 11/21/2017 02/27/17   Fatima Blank A, CNM  metroNIDAZOLE (FLAGYL) 500 MG tablet Take 1 tablet (500 mg total) by mouth 2 (two) times daily. Patient not taking: Reported on 11/21/2017 10/13/17   Virginia Rochester, NP  pantoprazole (PROTONIX) 40 MG tablet Take  1 tablet (40 mg total) 2 (two) times daily by mouth. Patient not taking: Reported on 11/21/2017 08/16/17   Rosita Fire, MD    Family History Family History  Problem Relation Age of Onset  . Cancer Mother     Social History Social History   Tobacco Use  . Smoking status: Former Smoker    Last attempt to quit: 08/04/2012    Years since quitting: 5.3  . Smokeless tobacco: Never Used  Substance Use Topics  . Alcohol use: Yes    Comment: OCCASIONAL  . Drug use: No     Allergies   Patient has no known allergies.   Review of Systems Review of Systems  Constitutional: Positive for appetite change. Negative for fever.  Respiratory: Negative for cough and shortness of breath.   Cardiovascular: Negative for chest pain.  Gastrointestinal: Positive for abdominal pain. Negative for diarrhea, nausea and vomiting.  Genitourinary: Negative for dysuria and hematuria.  Musculoskeletal: Negative for back pain and neck pain.  Neurological: Negative for dizziness, weakness, numbness and headaches.  Psychiatric/Behavioral: Negative for confusion.  All other systems reviewed and are negative.    Physical Exam Updated Vital Signs BP 116/76 (BP Location: Right Arm)   Pulse 72   Temp 98 F (36.7 C) (Oral)   Resp 18   LMP 11/11/2017   SpO2 100%   Physical Exam  Constitutional: She is oriented to person, place, and time. She appears well-developed and well-nourished.  HENT:  Head: Normocephalic and atraumatic.  Mouth/Throat: Oropharynx is clear and moist and mucous membranes are normal.  Eyes: Conjunctivae, EOM and lids are normal. Pupils are equal, round, and reactive to light.  Neck: Full passive range of motion without pain.  Cardiovascular: Normal rate, regular rhythm, normal heart sounds and normal pulses. Exam reveals no gallop and no friction rub.  No murmur heard. Pulmonary/Chest: Effort normal and breath sounds normal.  Abdominal: Soft. Normal appearance and bowel  sounds are normal. There is tenderness in the right upper quadrant, epigastric area and left upper quadrant. There is no rigidity, no guarding and negative Murphy's sign.  Abdomen is soft, nondistended.  Patient does exhibit tenderness to the epigastric, bilateral upper quadrant region. No peritoneal signs.  No rigidity, guarding.  No Murphy's sign.   Musculoskeletal: Normal range of motion.  Neurological: She is alert and oriented to person, place, and time.  Skin: Skin is warm and dry. Capillary refill takes less than 2 seconds.  Psychiatric: She has a normal mood and affect. Her speech is normal.  Nursing note and vitals reviewed.    ED Treatments / Results  Labs (all labs ordered are listed, but only abnormal results are displayed) Labs Reviewed  COMPREHENSIVE METABOLIC PANEL - Abnormal;  Notable for the following components:      Result Value   Glucose, Bld 108 (*)    Total Protein 6.3 (*)    All other components within normal limits  CBC - Abnormal; Notable for the following components:   WBC 12.6 (*)    Hemoglobin 11.2 (*)    HCT 34.9 (*)    All other components within normal limits  URINALYSIS, ROUTINE W REFLEX MICROSCOPIC - Abnormal; Notable for the following components:   APPearance HAZY (*)    All other components within normal limits  LIPASE, BLOOD  I-STAT BETA HCG BLOOD, ED ([facility], [facility], [facility] ONLY)    EKG  EKG Interpretation None       Radiology Ct Abdomen Pelvis W Contrast  Result Date: 11/20/2017 CLINICAL DATA:  Acute abdominal pain.  Upper abdominal pain. EXAM: CT ABDOMEN AND PELVIS WITH CONTRAST TECHNIQUE: Multidetector CT imaging of the abdomen and pelvis was performed using the standard protocol following bolus administration of intravenous contrast. CONTRAST:  100 cc Isovue-300 IV ISOVUE-300 IOPAMIDOL (ISOVUE-300) INJECTION 61% COMPARISON:  CT 08/15/2017 FINDINGS: Lower chest: Lung bases are clear. No pleural fluid or consolidation. Bilateral breast implants.  Hepatobiliary: No focal hepatic lesion. Enlarged liver spanning 22 cm cranial caudal. Gallstone without gallbladder inflammation or biliary dilatation. Pancreas: No ductal dilatation or inflammation. Spleen: Normal in size without focal abnormality. Adrenals/Urinary Tract: Normal adrenal glands. No hydronephrosis or perinephric edema. Homogeneous renal enhancement. Urinary bladder is near completely decompressed. Stomach/Bowel: Post gastric bypass. Mild wall thickening at the gastro jejunal anastomosis at site of prior outpouching, axial image 19 series 2. mild adjacent soft tissue inflammation demonstrated on coronal image 37 series 4. Minimal fluid in the excluded gastric remnant. No other bowel wall thickening or inflammation. No bowel obstruction. Normal appendix. Moderate colonic stool burden. Mild fecalization of small bowel contents of pelvic small bowel. Vascular/Lymphatic: Normal caliber abdominal aorta. No acute vascular findings. No enlarged abdominal or pelvic lymph nodes. Reproductive: Uterus and bilateral adnexa are unremarkable. Small volume simple free fluid in the pelvis likely physiologic. Other: No upper abdominal ascites. No free air. No intra-abdominal abscess. Musculoskeletal: There are no acute or suspicious osseous abnormalities. IMPRESSION: 1. Post gastric bypass. Focal wall thickening and mild adjacent inflammation in the region of the gastro jejunal anastomosis, similar location to prior inflammatory change and suspicious for marginal ulcer. No frank perforation. 2. Cholelithiasis without gallbladder inflammation. Electronically Signed   By: Jeb Levering M.D.   On: 11/20/2017 22:29    Procedures Procedures (including critical care time)  Medications Ordered in ED Medications  sodium chloride 0.9 % bolus 1,000 mL (0 mLs Intravenous Stopped 11/20/17 2335)  morphine 4 MG/ML injection 4 mg (4 mg Intravenous Given 11/20/17 2129)  pantoprazole (PROTONIX) injection 20 mg (20 mg  Intravenous Given 11/20/17 2129)  iopamidol (ISOVUE-300) 61 % injection (100 mLs Intravenous Contrast Given 11/20/17 2144)  HYDROmorphone (DILAUDID) injection 1 mg (1 mg Intravenous Given 11/20/17 2335)  pantoprazole (PROTONIX) injection 20 mg (20 mg Intravenous Given 11/20/17 2335)     Initial Impression / Assessment and Plan / ED Course  I have reviewed the triage vital signs and the nursing notes.  Pertinent labs & imaging results that were available during my care of the patient were reviewed by me and considered in my medical decision making (see chart for details).     37 year old female with past medical history of Roux-en-Y gastric bypass who presents for evaluation of 4 days of progressively worsening abdominal pain.  Worse with eating.  No nausea/vomiting, fever. Patient is afebrile, non-toxic appearing, sitting comfortably on examination table. Vital signs reviewed and stable.  Had similar symptoms in 2018 and was admitted.  At that time, EGD showed a concern for non-perforated ulcer.  Was discharged home with PPI and instructions to follow-up with outpatient GI.  Patient states that she did not follow-up with GI and is not taking the PPI as directed.  On exam, patient with tenderness to the epigastric and bilateral upper quadrant region.  No rigidity, guarding.  No peritoneal signs.  Consider PUD versus gastritis versus pancreatitis.  History/physical exam is not concerning for perforation, small bowel obstruction, appendicitis, diverticulitis.  Initial labs ordered at triage.  We will plan for CT abdomen pelvis evaluation.  Patient given analgesics and Protonix here in the ED.  Labs reviewed.  UA is without any acute infectious etiology.  I-STAT beta is negative.  CMP shows no acute abnormalities.  Alk phos, AST, ALT are within normal limits.  Do not suspect gallbladder etiology at this time.  CBC with slight leukocytosis of 12.6.  H&H is stable.  CT abdomen pelvis shows focal wall  thickening and mild adjacent inflammation in the region of the gastrojejunal anastomosis with similar location to prior inflammatory change suspicious for marginal ulcer.  No frank perforation noted.  Discussed patient with Dr. Havery Moros (GI).  Recommends giving fluids here in the department.  Also recommends doing IV Protonix 40 mg twice daily.  He recommends medical admission with plans for GI consult tomorrow morning for possible EGD.    Reevaluation after analgesics.  Patient reports still having pain.  She was able to take a few sips but cannot eat anything else secondary to pain.  Plan to consult for admission.  Discussed patient with Dr. Herbert Moors (hospitalist). Will plan for admission.    Final Clinical Impressions(s) / ED Diagnoses   Final diagnoses:  Marginal ulcer  Epigastric pain    ED Discharge Orders    None       Desma Mcgregor 11/21/17 0107    Julianne Rice, MD 11/21/17 1550

## 2017-11-20 NOTE — ED Triage Notes (Signed)
s c/o upper abd. Pain which is identical to pain she experienced in Nov. Of 2018, at which time she was dx with peptic ulcer r/t her s/p gastric bypass. She underwent endoscopy, which helped. She states she was to have another endoscopy, to which she did not go.

## 2017-11-21 ENCOUNTER — Other Ambulatory Visit: Payer: Self-pay

## 2017-11-21 ENCOUNTER — Encounter (HOSPITAL_COMMUNITY): Payer: Self-pay | Admitting: Internal Medicine

## 2017-11-21 DIAGNOSIS — Z8711 Personal history of peptic ulcer disease: Secondary | ICD-10-CM | POA: Diagnosis not present

## 2017-11-21 DIAGNOSIS — K289 Gastrojejunal ulcer, unspecified as acute or chronic, without hemorrhage or perforation: Secondary | ICD-10-CM | POA: Diagnosis present

## 2017-11-21 DIAGNOSIS — K279 Peptic ulcer, site unspecified, unspecified as acute or chronic, without hemorrhage or perforation: Secondary | ICD-10-CM | POA: Diagnosis present

## 2017-11-21 DIAGNOSIS — Z9884 Bariatric surgery status: Secondary | ICD-10-CM | POA: Diagnosis not present

## 2017-11-21 DIAGNOSIS — K760 Fatty (change of) liver, not elsewhere classified: Secondary | ICD-10-CM | POA: Diagnosis present

## 2017-11-21 DIAGNOSIS — Z87891 Personal history of nicotine dependence: Secondary | ICD-10-CM | POA: Diagnosis not present

## 2017-11-21 DIAGNOSIS — Z79899 Other long term (current) drug therapy: Secondary | ICD-10-CM | POA: Diagnosis not present

## 2017-11-21 DIAGNOSIS — R1013 Epigastric pain: Secondary | ICD-10-CM

## 2017-11-21 DIAGNOSIS — D509 Iron deficiency anemia, unspecified: Secondary | ICD-10-CM | POA: Diagnosis present

## 2017-11-21 DIAGNOSIS — K283 Acute gastrojejunal ulcer without hemorrhage or perforation: Secondary | ICD-10-CM | POA: Diagnosis not present

## 2017-11-21 DIAGNOSIS — K8020 Calculus of gallbladder without cholecystitis without obstruction: Secondary | ICD-10-CM | POA: Diagnosis present

## 2017-11-21 LAB — LIPID PANEL
Cholesterol: 136 mg/dL (ref 0–200)
HDL: 57 mg/dL (ref >40)
LDL Cholesterol: 65 mg/dL (ref 0–99)
Total CHOL/HDL Ratio: 2.4 RATIO
Triglycerides: 71 mg/dL (ref <150)
VLDL: 14 mg/dL (ref 0–40)

## 2017-11-21 LAB — HEMOGLOBIN A1C
Hgb A1c MFr Bld: 5.9 % — ABNORMAL HIGH (ref 4.8–5.6)
Mean Plasma Glucose: 122.63 mg/dL

## 2017-11-21 LAB — BASIC METABOLIC PANEL
BUN: 11 mg/dL (ref 6–20)
CO2: 26 mmol/L (ref 22–32)
Chloride: 109 mmol/L (ref 101–111)
Creatinine, Ser: 0.85 mg/dL (ref 0.44–1.00)
GFR calc non Af Amer: >60 mL/min (ref >60)
Glucose, Bld: 97 mg/dL (ref 65–99)
Potassium: 4 mmol/L (ref 3.5–5.1)

## 2017-11-21 LAB — CBC
HCT: 30.4 % — ABNORMAL LOW (ref 36.0–46.0)
Hemoglobin: 9.6 g/dL — ABNORMAL LOW (ref 12.0–15.0)
MCH: 27.1 pg (ref 26.0–34.0)
MCHC: 31.6 g/dL (ref 30.0–36.0)
MCV: 85.9 fL (ref 78.0–100.0)
Platelets: 258 K/uL (ref 150–400)
RBC: 3.54 MIL/uL — ABNORMAL LOW (ref 3.87–5.11)
RDW: 14.7 % (ref 11.5–15.5)
WBC: 7.8 K/uL (ref 4.0–10.5)

## 2017-11-21 LAB — HIV ANTIBODY (ROUTINE TESTING W REFLEX): HIV Screen 4th Generation wRfx: NONREACTIVE

## 2017-11-21 LAB — BASIC METABOLIC PANEL WITH GFR
Anion gap: 7 (ref 5–15)
Calcium: 8.4 mg/dL — ABNORMAL LOW (ref 8.9–10.3)
GFR calc Af Amer: >60 mL/min (ref >60)
Sodium: 142 mmol/L (ref 135–145)

## 2017-11-21 LAB — RPR: RPR Ser Ql: NONREACTIVE

## 2017-11-21 MED ORDER — ACETAMINOPHEN 650 MG RE SUPP: 650.0000 mg | Freq: Four times a day (QID) | RECTAL | Status: DC | PRN

## 2017-11-21 MED ORDER — HYDROMORPHONE HCL 1 MG/ML IJ SOLN
0.5000 mg | INTRAMUSCULAR | Status: AC | PRN
  Administered 2017-11-21 (×2): 0.5 mg via INTRAVENOUS
  Filled 2017-11-21 (×2): qty 0.5

## 2017-11-21 MED ORDER — PANTOPRAZOLE SODIUM 40 MG IV SOLR
40.0000 mg | Freq: Two times a day (BID) | INTRAVENOUS | Status: DC
  Administered 2017-11-21 (×2): 40 mg via INTRAVENOUS
  Filled 2017-11-21 (×3): qty 40

## 2017-11-21 MED ORDER — ONDANSETRON HCL 4 MG/2ML IJ SOLN: 4.0000 mg | Freq: Four times a day (QID) | INTRAMUSCULAR | Status: DC | PRN

## 2017-11-21 MED ORDER — OXYCODONE HCL 5 MG PO TABS
5.0000 mg | ORAL_TABLET | ORAL | Status: DC | PRN
  Administered 2017-11-21 (×2): 5 mg via ORAL
  Filled 2017-11-21 (×2): qty 1

## 2017-11-21 MED ORDER — SODIUM CHLORIDE 0.9 % IV SOLN
INTRAVENOUS | Status: DC
  Administered 2017-11-21 – 2017-11-22 (×4): via INTRAVENOUS

## 2017-11-21 MED ORDER — ACETAMINOPHEN 325 MG PO TABS: 650.0000 mg | ORAL_TABLET | Freq: Four times a day (QID) | ORAL | Status: DC | PRN

## 2017-11-21 MED ORDER — ONDANSETRON HCL 4 MG PO TABS: 4.0000 mg | ORAL_TABLET | Freq: Four times a day (QID) | ORAL | Status: DC | PRN

## 2017-11-21 NOTE — ED Notes (Signed)
ED TO INPATIENT HANDOFF REPORT  Name/Age/Gender Patricia Wise 37 y.o. female  Code Status    Code Status Orders  (From admission, onward)        Start     Ordered   11/21/17 0226  Full code  Continuous     11/21/17 0225    Code Status History    Date Active Date Inactive Code Status Order ID Comments User Context   08/16/2017 08:05 08/16/2017 17:58 Full Code [id number]  Toy Baker, MD Inpatient   08/14/2015 16:35 08/16/2015 16:50 Full Code [id number]  Tonna Boehringer, MD Inpatient      Home/SNF/Other Home  Chief Complaint abdominal pain  Level of Care/Admitting Diagnosis ED Disposition    ED Disposition Condition [place] [facility]: [facility] [[id number]]  Level of Care: Med-Surg [16]  Diagnosis: PUD (peptic ulcer disease) [503546]  Admitting Physician: Cristy Folks [[id number]]  Attending Physician: Cristy Folks [[id number]]  PT Class (Do Not Modify): Observation [104]  PT Acc Code (Do Not Modify): Observation [10022]       Medical History Past Medical History:  Diagnosis Date  . BV (bacterial vaginosis) 07/2015   also candidial vaginosis 03/2017  . Fatty liver 05/2017   noted along with gallstone in neck of GB on CT 08/15/17, LFTs normal at that time.   . Morbid obesity ([place]) 2014   s/p gastric bypass in [place].    Herold Harms   . Normocytic anemia 02/2017  . Preterm labor    07/2007  . Vaginal Pap smear, abnormal     Allergies No Known Allergies  IV Location/Drains/Wounds Patient Lines/Drains/Airways Status   Active Line/Drains/Airways    Name:   Placement date:   Placement time:   Site:   Days:   Peripheral IV 11/20/17 Right Forearm   11/20/17    2122    Forearm   1          Labs/Imaging Results for orders placed or performed during the hospital encounter of 11/20/17 (from the past 48 hour(s))  Lipase, blood     Status: None   Collection Time: 11/20/17  3:56 PM  Result Value Ref Range   Lipase  28 11 - 51 U/L    Comment: Performed at [facility], [place] [address], [place], [place] [address]  Comprehensive metabolic panel     Status: Abnormal   Collection Time: 11/20/17  3:56 PM  Result Value Ref Range   Sodium 141 135 - 145 mmol/L   Potassium 4.4 3.5 - 5.1 mmol/L   Chloride 106 101 - 111 mmol/L   CO2 28 22 - 32 mmol/L   Glucose, Bld 108 (H) 65 - 99 mg/dL   BUN 11 6 - 20 mg/dL   Creatinine, Ser 0.78 0.44 - 1.00 mg/dL   Calcium 8.9 8.9 - 10.3 mg/dL   Total Protein 6.3 (L) 6.5 - 8.1 g/dL   Albumin 3.7 3.5 - 5.0 g/dL   AST 33 15 - 41 U/L   ALT 18 14 - 54 U/L   Alkaline Phosphatase 49 38 - 126 U/L   Total Bilirubin 0.3 0.3 - 1.2 mg/dL   GFR calc non Af Amer >60 >60 mL/min   GFR calc Af Amer >60 >60 mL/min    Comment: (NOTE) The eGFR has been calculated using the CKD EPI equation. This calculation has not been validated in all clinical situations. eGFR's persistently <60 mL/min signify possible Chronic Kidney Disease.  Anion gap 7 5 - 15    Comment: Performed at [facility], [place] [address] [place], [place] [address]  CBC     Status: Abnormal   Collection Time: 11/20/17  3:56 PM  Result Value Ref Range   WBC 12.6 (H) 4.0 - 10.5 K/uL   RBC 4.08 3.87 - 5.11 MIL/uL   Hemoglobin 11.2 (L) 12.0 - 15.0 g/dL   HCT 34.9 (L) 36.0 - 46.0 %   MCV 85.5 78.0 - 100.0 fL   MCH 27.5 26.0 - 34.0 pg   MCHC 32.1 30.0 - 36.0 g/dL   RDW 15.0 11.5 - 15.5 %   Platelets 337 150 - 400 K/uL    Comment: Performed at [facility], [place] [address], [place], [place] [address]  I-Stat beta hCG blood, ED     Status: None   Collection Time: 11/20/17  4:08 PM  Result Value Ref Range   I-stat hCG, quantitative <5.0 <5 mIU/mL   Comment 3            Comment:   GEST. AGE      CONC.  (mIU/mL)   <=1 WEEK        5 - 50     2 WEEKS       50 - 500     3 WEEKS       100 - 10,000     4 WEEKS     1,000 - 30,000        FEMALE AND NON-PREGNANT FEMALE:      LESS THAN 5 mIU/mL   Urinalysis, Routine w reflex microscopic     Status: Abnormal   Collection Time: 11/20/17  4:31 PM  Result Value Ref Range   Color, Urine YELLOW YELLOW   APPearance HAZY (A) CLEAR   Specific Gravity, Urine 1.016 1.005 - 1.030   pH 7.0 5.0 - 8.0   Glucose, UA NEGATIVE NEGATIVE mg/dL   Hgb urine dipstick NEGATIVE NEGATIVE   Bilirubin Urine NEGATIVE NEGATIVE   Ketones, ur NEGATIVE NEGATIVE mg/dL   Protein, ur NEGATIVE NEGATIVE mg/dL   Nitrite NEGATIVE NEGATIVE   Leukocytes, UA NEGATIVE NEGATIVE    Comment: Performed at [facility], [place] [address], [place], [place] [address]   Ct Abdomen Pelvis W Contrast  Result Date: 11/20/2017 CLINICAL DATA:  Acute abdominal pain.  Upper abdominal pain. EXAM: CT ABDOMEN AND PELVIS WITH CONTRAST TECHNIQUE: Multidetector CT imaging of the abdomen and pelvis was performed using the standard protocol following bolus administration of intravenous contrast. CONTRAST:  100 cc Isovue-300 IV ISOVUE-300 IOPAMIDOL (ISOVUE-300) INJECTION 61% COMPARISON:  CT 08/15/2017 FINDINGS: Lower chest: Lung bases are clear. No pleural fluid or consolidation. Bilateral breast implants. Hepatobiliary: No focal hepatic lesion. Enlarged liver spanning 22 cm cranial caudal. Gallstone without gallbladder inflammation or biliary dilatation. Pancreas: No ductal dilatation or inflammation. Spleen: Normal in size without focal abnormality. Adrenals/Urinary Tract: Normal adrenal glands. No hydronephrosis or perinephric edema. Homogeneous renal enhancement. Urinary bladder is near completely decompressed. Stomach/Bowel: Post gastric bypass. Mild wall thickening at the gastro jejunal anastomosis at site of prior outpouching, axial image 19 series 2. mild adjacent soft tissue inflammation demonstrated on coronal image 37 series 4. Minimal fluid in the excluded gastric remnant. No other bowel wall thickening or inflammation. No bowel obstruction. Normal  appendix. Moderate colonic stool burden. Mild fecalization of small bowel contents of pelvic small bowel. Vascular/Lymphatic: Normal caliber abdominal aorta. No acute vascular findings. No enlarged abdominal  or pelvic lymph nodes. Reproductive: Uterus and bilateral adnexa are unremarkable. Small volume simple free fluid in the pelvis likely physiologic. Other: No upper abdominal ascites. No free air. No intra-abdominal abscess. Musculoskeletal: There are no acute or suspicious osseous abnormalities. IMPRESSION: 1. Post gastric bypass. Focal wall thickening and mild adjacent inflammation in the region of the gastro jejunal anastomosis, similar location to prior inflammatory change and suspicious for marginal ulcer. No frank perforation. 2. Cholelithiasis without gallbladder inflammation. Electronically Signed   By: Jeb Levering M.D.   On: 11/20/2017 22:29    Pending Labs Unresulted Labs (From admission, onward)   Start     Ordered   11/21/17 5465  Basic metabolic panel  Tomorrow morning,   R     11/21/17 0225   11/21/17 0500  CBC  Tomorrow morning,   R     11/21/17 0225   11/21/17 0500  Hemoglobin A1c  Tomorrow morning,   R     11/21/17 0225   11/21/17 0500  Lipid panel  Tomorrow morning,   R     11/21/17 0225   11/21/17 0500  HIV antibody  Tomorrow morning,   R     11/21/17 0225   11/21/17 0500  RPR  Tomorrow morning,   R     11/21/17 0225      Vitals/Pain Today's Vitals   11/21/17 0000 11/21/17 0030 11/21/17 0045 11/21/17 0100  BP: 109/85 120/78  109/65  Pulse:   (!) 56 (!) 57  Resp:    18  Temp:      TempSrc:      SpO2:    100%  PainSc:        Isolation Precautions No active isolations  Medications Medications  pantoprazole (PROTONIX) injection 40 mg (not administered)  0.9 %  sodium chloride infusion ( Intravenous New Bag/Given 11/21/17 0223)  acetaminophen (TYLENOL) tablet 650 mg (not administered)    Or  acetaminophen (TYLENOL) suppository 650 mg (not administered)   oxyCODONE (Oxy IR/ROXICODONE) immediate release tablet 5 mg (not administered)  ondansetron (ZOFRAN) tablet 4 mg (not administered)    Or  ondansetron (ZOFRAN) injection 4 mg (not administered)  sodium chloride 0.9 % bolus 1,000 mL (0 mLs Intravenous Stopped 11/20/17 2335)  morphine 4 MG/ML injection 4 mg (4 mg Intravenous Given 11/20/17 2129)  pantoprazole (PROTONIX) injection 20 mg (20 mg Intravenous Given 11/20/17 2129)  iopamidol (ISOVUE-300) 61 % injection (100 mLs Intravenous Contrast Given 11/20/17 2144)  HYDROmorphone (DILAUDID) injection 1 mg (1 mg Intravenous Given 11/20/17 2335)  pantoprazole (PROTONIX) injection 20 mg (20 mg Intravenous Given 11/20/17 2335)    Mobility walks

## 2017-11-21 NOTE — Consult Note (Signed)
[facility] Consultation Note  Referring Provider: Dr. Eulogio Bear [facility]) Primary Care Physician:  Patient, No Pcp Per Primary Gastroenterologist:  None  Reason for Consultation:  Abdominal pain  HPI: Patricia Wise is a 37 y.o. female admitted with couple day history of progressive epigastric pain.  Similar as pain from November 2018, at which time she had endoscopy showing marginal ulcer from prior gastric bypass.  No GERD, nausea, vomiting, melena, hematemesis.  No constipation or change in bowel habits.  Took PPI x 2 weeks after endoscopy in November, then stopped taking.  H. Pylori status uncertain.  Denies NSAIDs.  No weight loss; lost about 90 lbs in total after her gastric bypass 5 years ago.   Past Medical History:  Diagnosis Date  . BV (bacterial vaginosis) 07/2015   also candidial vaginosis 03/2017  . Fatty liver 05/2017   noted along with gallstone in neck of GB on CT 08/15/17, LFTs normal at that time.   . Morbid obesity ([place]) 2014   s/p gastric bypass in [place].    Herold Harms   . Normocytic anemia 02/2017  . Preterm labor    07/2007  . Vaginal Pap smear, abnormal     Past Surgical History:  Procedure Laterality Date  . BREAST SURGERY     augmentation  . CESAREAN SECTION    . CESAREAN SECTION N/A 08/14/2015    REPEAT CESAREAN SECTION;  Surgeon: Donnamae Jude, MD;   . DILATION AND CURETTAGE OF UTERUS    . DILATION AND EVACUATION N/A 02/18/2017   for missed Ab.   . ESOPHAGOGASTRODUODENOSCOPY (EGD) WITH PROPOFOL N/A 08/16/2017   Procedure: ESOPHAGOGASTRODUODENOSCOPY (EGD) WITH PROPOFOL;  Surgeon: Irene Shipper, MD;  Location: [facility];  Service: Endoscopy;  Laterality: N/A;  . GASTRIC BYPASS  2013 vs 2014   in [place]  . TONSILLECTOMY    . WRIST SURGERY Bilateral    carpal tunnel    Prior to Admission medications   Medication Sig Start Date End Date Taking? Authorizing Provider  acetaminophen (TYLENOL) 500 MG tablet Take 1,000 mg by mouth every  6 (six) hours as needed for mild pain, moderate pain or headache.   Yes [provider]  ferrous sulfate (FERROUSUL) 325 (65 FE) MG tablet Take 1 tablet (325 mg total) by mouth 2 (two) times daily. Patient not taking: Reported on 11/21/2017 02/27/17   Fatima Blank A, CNM  metroNIDAZOLE (FLAGYL) 500 MG tablet Take 1 tablet (500 mg total) by mouth 2 (two) times daily. Patient not taking: Reported on 11/21/2017 10/13/17   Virginia Rochester, NP  pantoprazole (PROTONIX) 40 MG tablet Take 1 tablet (40 mg total) 2 (two) times daily by mouth. Patient not taking: Reported on 11/21/2017 08/16/17   Rosita Fire, MD    Current Facility-Administered Medications  Medication Dose Route Frequency Provider Last Rate Last Dose  . 0.9 %  sodium chloride infusion   Intravenous Continuous Purohit, Shrey C, MD 125 mL/hr at 11/21/17 1029    . acetaminophen (TYLENOL) tablet 650 mg  650 mg Oral Q6H PRN Purohit, Shrey C, MD       Or  . acetaminophen (TYLENOL) suppository 650 mg  650 mg Rectal Q6H PRN Purohit, Shrey C, MD      . ondansetron (ZOFRAN) tablet 4 mg  4 mg Oral Q6H PRN Purohit, Shrey C, MD       Or  . ondansetron (ZOFRAN) injection 4 mg  4 mg Intravenous Q6H PRN Purohit, Konrad Dolores, MD      .  oxyCODONE (Oxy IR/ROXICODONE) immediate release tablet 5 mg  5 mg Oral Q4H PRN Purohit, Shrey C, MD      . pantoprazole (PROTONIX) injection 40 mg  40 mg Intravenous Q12H Purohit, Shrey C, MD   40 mg at 11/21/17 0948    Allergies as of 11/20/2017  . (No Known Allergies)    Family History  Problem Relation Age of Onset  . Cancer Mother     Social History   Socioeconomic History  . Marital status: Single    Spouse name: Not on file  . Number of children: Not on file  . Years of education: Not on file  . Highest education level: Not on file  Social Needs  . Financial resource strain: Not on file  . Food insecurity - worry: Not on file  . Food insecurity - inability: Not on file  .  Transportation needs - medical: Not on file  . Transportation needs - non-medical: Not on file  Occupational History  . Not on file  Tobacco Use  . Smoking status: Former Smoker    Last attempt to quit: 08/04/2012    Years since quitting: 5.3  . Smokeless tobacco: Never Used  Substance and Sexual Activity  . Alcohol use: Yes    Comment: OCCASIONAL  . Drug use: No  . Sexual activity: Yes    Birth control/protection: None  Other Topics Concern  . Not on file  Social History Narrative  . Not on file    Review of Systems: As per HPI, all others negative  Physical Exam: Vital signs in last 24 hours: Temp:  [98 F (36.7 C)-98.4 F (36.9 C)] 98 F (36.7 C) (02/18 0605) Pulse Rate:  [55-77] 64 (02/18 0605) Resp:  [14-18] 18 (02/18 0605) BP: (99-120)/(56-85) 99/56 (02/18 0605) SpO2:  [97 %-100 %] 99 % (02/18 0605) Weight:  [212 lb 1.3 oz (96.2 kg)] 212 lb 1.3 oz (96.2 kg) (02/18 0326) Last BM Date: 11/20/17 General:   Alert,  Overweight, Well-developed, well-nourished, pleasant and cooperative in NAD Head:  Normocephalic and atraumatic. Eyes:  Sclera clear, no icterus.   Conjunctiva pink. Ears:  Normal auditory acuity. Nose:  No deformity, discharge,  or lesions. Mouth:  No deformity or lesions.  Oropharynx pink & moist. Neck:  Supple; no masses or thyromegaly. Abdomen:  Soft, mild diffuse upper abdominal tenderness without peritonitis; nondistended. No masses, hepatosplenomegaly or hernias noted. Normal bowel sounds, without guarding, and without rebound.     Msk:  Symmetrical without gross deformities. Normal posture. Pulses:  Normal pulses noted. Extremities:  Without clubbing or edema. Neurologic:  Alert and  oriented x4;  grossly normal neurologically. Skin:  Multiple tattoos, Intact without significant lesions or rashes. Cervical Nodes:  No significant cervical adenopathy. Psych:  Alert and cooperative. Normal mood and affect.   Lab Results: Recent Labs     11/20/17 1556 11/21/17 0608  WBC 12.6* 7.8  HGB 11.2* 9.6*  HCT 34.9* 30.4*  PLT 337 258   BMET Recent Labs    11/20/17 1556 11/21/17 0608  NA 141 142  K 4.4 4.0  CL 106 109  CO2 28 26  GLUCOSE 108* 97  BUN 11 11  CREATININE 0.78 0.85  CALCIUM 8.9 8.4*   LFT Recent Labs    11/20/17 1556  PROT 6.3*  ALBUMIN 3.7  AST 33  ALT 18  ALKPHOS 49  BILITOT 0.3   PT/INR No results for input(s): LABPROT, INR in the last 72 hours.  Studies/Results: Ct  Abdomen Pelvis W Contrast  Result Date: 11/20/2017 CLINICAL DATA:  Acute abdominal pain.  Upper abdominal pain. EXAM: CT ABDOMEN AND PELVIS WITH CONTRAST TECHNIQUE: Multidetector CT imaging of the abdomen and pelvis was performed using the standard protocol following bolus administration of intravenous contrast. CONTRAST:  100 cc Isovue-300 IV ISOVUE-300 IOPAMIDOL (ISOVUE-300) INJECTION 61% COMPARISON:  CT 08/15/2017 FINDINGS: Lower chest: Lung bases are clear. No pleural fluid or consolidation. Bilateral breast implants. Hepatobiliary: No focal hepatic lesion. Enlarged liver spanning 22 cm cranial caudal. Gallstone without gallbladder inflammation or biliary dilatation. Pancreas: No ductal dilatation or inflammation. Spleen: Normal in size without focal abnormality. Adrenals/Urinary Tract: Normal adrenal glands. No hydronephrosis or perinephric edema. Homogeneous renal enhancement. Urinary bladder is near completely decompressed. Stomach/Bowel: Post gastric bypass. Mild wall thickening at the gastro jejunal anastomosis at site of prior outpouching, axial image 19 series 2. mild adjacent soft tissue inflammation demonstrated on coronal image 37 series 4. Minimal fluid in the excluded gastric remnant. No other bowel wall thickening or inflammation. No bowel obstruction. Normal appendix. Moderate colonic stool burden. Mild fecalization of small bowel contents of pelvic small bowel. Vascular/Lymphatic: Normal caliber abdominal aorta. No acute  vascular findings. No enlarged abdominal or pelvic lymph nodes. Reproductive: Uterus and bilateral adnexa are unremarkable. Small volume simple free fluid in the pelvis likely physiologic. Other: No upper abdominal ascites. No free air. No intra-abdominal abscess. Musculoskeletal: There are no acute or suspicious osseous abnormalities. IMPRESSION: 1. Post gastric bypass. Focal wall thickening and mild adjacent inflammation in the region of the gastro jejunal anastomosis, similar location to prior inflammatory change and suspicious for marginal ulcer. No frank perforation. 2. Cholelithiasis without gallbladder inflammation. Electronically Signed   By: Jeb Levering M.D.   On: 11/20/2017 22:29    Impression:  1.  Abdominal pain.  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 2.  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  No overt GI bleeding. 3.  Gallstones.  Doubt this is cause of her abdominal pain.  Plan:  1.  Resume pantoprazole and continue 40 mg bid x 6 weeks, then 40 mg po qd indefinitely. 2.  No NSAIDs. 3.  Check H. Pylori serologies. 4.  EGD as outpatient after continuous PPI x 2-3 months; patient moving to [place] on Friday; this should be coordinated to be done there.  Don't see any clear reason to repeat EGD now, as it would highly likely just show an ulcer like was seen in November 2018. 5.  Advance diet; ok to discharge home once patient's analgesia is adequately controlled and once patient can tolerate diet. 6.  [facility] will follow.   LOS: 0 days   Simren Popson M  11/21/2017, 11:04 AM  Cell [phone] If no answer or after 5 PM call [phone]

## 2017-11-21 NOTE — Progress Notes (Signed)
Patient admitted after midnight, please see H&P.  Took protonix for about 2 weeks after last d/c.  Back with abdominal pain.  GI to see.  ? Ulcer on CT scan  Eulogio Bear DO

## 2017-11-21 NOTE — Progress Notes (Addendum)
Nurse sent message to Dr. Eliseo Squires to make provider aware of pt requesting diet to be advanced.   Per Dr. Eliseo Squires, advance patient to soft diet. Nurse entered order.

## 2017-11-21 NOTE — H&P (Signed)
History and Physical    Patricia Wise GUY:[id number] DOB: Mar 13, 1981 DOA: 11/20/2017  PCP: Patient, No Pcp Per   Patient coming from: Home   Chief Complaint: Abdominal pain  HPI: Patricia Wise is a 37 y.o. 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  Patient reports that she has been having severe epigastric pain that is sharp.  Pain waxes and wanes.  Pain radiates across the upper portion of her abdomen.  She reports that eating and drinking makes the pain significantly worse.  She denies any nausea, vomiting, diarrhea.  She denies any fevers, chest pain, rashes, dysuria, hematuria, vaginal discharge. Patient was admitted 08/16/2017 and had an EGD done that showed large ulcers just distal to the gastrojejunal anastomosis.  GI recommended starting outpatient Protonix twice daily and following up with GI as an outpatient.  Patient did not follow-up as an outpatient did not take Protonix twice daily.  Patient's symptoms had initially resolved on their own.  ED Course: In the ED vitals were unremarkable.  Labs were notable forA mildly elevated white blood cell count of 12.6.  Hemoglobin was 11.2 which was improved from 1 year ago.  CT abdomen pelvis showed inflammation around the surgical site of the Roux-en-Y.  GI was consulted and recommended admission with possible EGD tomorrow.  Patient was started on IV Protonix 40 mg twice daily  Review of Systems: As per HPI otherwise 10 point review of systems negative.    Past Medical History:  Diagnosis Date  . BV (bacterial vaginosis) 07/2015   also candidial vaginosis 03/2017  . Fatty liver 05/2017   noted along with gallstone in neck of GB on CT 08/15/17, LFTs normal at that time.   . Morbid obesity ([place]) 2014   s/p gastric bypass in [place].    [facility]   .  Normocytic anemia 02/2017  . Preterm labor    07/2007  . Vaginal Pap smear, abnormal     Past Surgical History:  Procedure Laterality Date  . BREAST SURGERY     augmentation  . CESAREAN SECTION    . CESAREAN SECTION N/A 08/14/2015    REPEAT CESAREAN SECTION;  Surgeon: Donnamae Jude, MD;   . DILATION AND CURETTAGE OF UTERUS    . DILATION AND EVACUATION N/A 02/18/2017   for missed Ab.   . ESOPHAGOGASTRODUODENOSCOPY (EGD) WITH PROPOFOL N/A 08/16/2017   Procedure: ESOPHAGOGASTRODUODENOSCOPY (EGD) WITH PROPOFOL;  Surgeon: Irene Shipper, MD;  Location: [facility];  Service: Endoscopy;  Laterality: N/A;  . GASTRIC BYPASS  2013 vs 2014   in [place]  . TONSILLECTOMY    . WRIST SURGERY Bilateral    carpal tunnel     reports that she quit smoking about 5 years ago. she has never used smokeless tobacco. She reports that she drinks alcohol. She reports that she does not use drugs.  No Known Allergies  Family History  Problem Relation Age of Onset  . Cancer Mother      Prior to Admission medications   Medication Sig Start Date End Date Taking? Authorizing Provider  acetaminophen (TYLENOL) 500 MG tablet Take 1,000 mg by mouth every 6 (six) hours as needed for mild pain, moderate pain or headache.   Yes [provider]  ferrous sulfate (FERROUSUL) 325 (65 FE) MG tablet Take 1 tablet (325 mg total) by mouth  2 (two) times daily. Patient not taking: Reported on 11/21/2017 02/27/17   Fatima Blank A, CNM  metroNIDAZOLE (FLAGYL) 500 MG tablet Take 1 tablet (500 mg total) by mouth 2 (two) times daily. Patient not taking: Reported on 11/21/2017 10/13/17   Virginia Rochester, NP  pantoprazole (PROTONIX) 40 MG tablet Take 1 tablet (40 mg total) 2 (two) times daily by mouth. Patient not taking: Reported on 11/21/2017 08/16/17   Rosita Fire, MD    Physical Exam: Vitals:   11/20/17 1551 11/20/17 2120  BP: 119/84 116/76  Pulse: 75 72  Resp: 18 18  Temp: 98 F (36.7 C)     TempSrc: Oral   SpO2: 100% 100%    Constitutional: NAD, calm, comfortable Vitals:   11/20/17 1551 11/20/17 2120  BP: 119/84 116/76  Pulse: 75 72  Resp: 18 18  Temp: 98 F (36.7 C)   TempSrc: Oral   SpO2: 100% 100%   Eyes: Anicteric sclera ENMT: Dry mucous membranes Neck: normal, supple Respiratory: clear to auscultation bilaterally, no wheezing, no crackles. Normal respiratory effort. No accessory muscle use.  Cardiovascular: Regular rate and rhythm, 2 out of 6 systolic murmur across precordium Abdomen: Soft, mild epigastric tenderness to palpation, no rebound, no guarding, normal bowel sounds Musculoskeletal: No lower extremity edema Skin: Port sites on abdomen are clean dry and intact Neurologic: Grossly intact moving all extremities Psychiatric: Normal judgment and insight. Alert and oriented x 3. Normal mood.   Labs on Admission: I have personally reviewed following labs and imaging studies  CBC: Recent Labs  Lab 11/20/17 1556  WBC 12.6*  HGB 11.2*  HCT 34.9*  MCV 85.5  PLT 784   Basic Metabolic Panel: Recent Labs  Lab 11/20/17 1556  NA 141  K 4.4  CL 106  CO2 28  GLUCOSE 108*  BUN 11  CREATININE 0.78  CALCIUM 8.9   GFR: CrCl cannot be calculated (Unknown ideal weight.). Liver Function Tests: Recent Labs  Lab 11/20/17 1556  AST 33  ALT 18  ALKPHOS 49  BILITOT 0.3  PROT 6.3*  ALBUMIN 3.7   Recent Labs  Lab 11/20/17 1556  LIPASE 28   No results for input(s): AMMONIA in the last 168 hours. Coagulation Profile: No results for input(s): INR, PROTIME in the last 168 hours. Cardiac Enzymes: No results for input(s): CKTOTAL, CKMB, CKMBINDEX, TROPONINI in the last 168 hours. BNP (last 3 results) No results for input(s): PROBNP in the last 8760 hours. HbA1C: No results for input(s): HGBA1C in the last 72 hours. CBG: No results for input(s): GLUCAP in the last 168 hours. Lipid Profile: No results for input(s): CHOL, HDL, LDLCALC, TRIG,  CHOLHDL, LDLDIRECT in the last 72 hours. Thyroid Function Tests: No results for input(s): TSH, T4TOTAL, FREET4, T3FREE, THYROIDAB in the last 72 hours. Anemia Panel: No results for input(s): VITAMINB12, FOLATE, FERRITIN, TIBC, IRON, RETICCTPCT in the last 72 hours. Urine analysis:    Component Value Date/Time   COLORURINE YELLOW 11/20/2017 1631   APPEARANCEUR HAZY (A) 11/20/2017 1631   LABSPEC 1.016 11/20/2017 1631   PHURINE 7.0 11/20/2017 1631   GLUCOSEU NEGATIVE 11/20/2017 1631   HGBUR NEGATIVE 11/20/2017 1631   BILIRUBINUR NEGATIVE 11/20/2017 1631   KETONESUR NEGATIVE 11/20/2017 1631   PROTEINUR NEGATIVE 11/20/2017 1631   UROBILINOGEN 0.2 02/15/2017 1013   NITRITE NEGATIVE 11/20/2017 1631   LEUKOCYTESUR NEGATIVE 11/20/2017 1631    Radiological Exams on Admission: Ct Abdomen Pelvis W Contrast  Result Date: 11/20/2017 CLINICAL DATA:  Acute abdominal  pain.  Upper abdominal pain. EXAM: CT ABDOMEN AND PELVIS WITH CONTRAST TECHNIQUE: Multidetector CT imaging of the abdomen and pelvis was performed using the standard protocol following bolus administration of intravenous contrast. CONTRAST:  100 cc Isovue-300 IV ISOVUE-300 IOPAMIDOL (ISOVUE-300) INJECTION 61% COMPARISON:  CT 08/15/2017 FINDINGS: Lower chest: Lung bases are clear. No pleural fluid or consolidation. Bilateral breast implants. Hepatobiliary: No focal hepatic lesion. Enlarged liver spanning 22 cm cranial caudal. Gallstone without gallbladder inflammation or biliary dilatation. Pancreas: No ductal dilatation or inflammation. Spleen: Normal in size without focal abnormality. Adrenals/Urinary Tract: Normal adrenal glands. No hydronephrosis or perinephric edema. Homogeneous renal enhancement. Urinary bladder is near completely decompressed. Stomach/Bowel: Post gastric bypass. Mild wall thickening at the gastro jejunal anastomosis at site of prior outpouching, axial image 19 series 2. mild adjacent soft tissue inflammation demonstrated  on coronal image 37 series 4. Minimal fluid in the excluded gastric remnant. No other bowel wall thickening or inflammation. No bowel obstruction. Normal appendix. Moderate colonic stool burden. Mild fecalization of small bowel contents of pelvic small bowel. Vascular/Lymphatic: Normal caliber abdominal aorta. No acute vascular findings. No enlarged abdominal or pelvic lymph nodes. Reproductive: Uterus and bilateral adnexa are unremarkable. Small volume simple free fluid in the pelvis likely physiologic. Other: No upper abdominal ascites. No free air. No intra-abdominal abscess. Musculoskeletal: There are no acute or suspicious osseous abnormalities. IMPRESSION: 1. Post gastric bypass. Focal wall thickening and mild adjacent inflammation in the region of the gastro jejunal anastomosis, similar location to prior inflammatory change and suspicious for marginal ulcer. No frank perforation. 2. Cholelithiasis without gallbladder inflammation. Electronically Signed   By: Jeb Levering M.D.   On: 11/20/2017 22:29    Assessment/Plan Active Problems:   H/O gastric bypass   Abdominal pain   Jejunal ulcer     ##) Abdominal pain: Likely etiology is recurrent or persistent GI ulcers.  Other causes could be biliary colic however her symptoms are not typical for this.  Low suspicion for PID the patient does have a history of chlamydia. -N.p.o. -IV fluids -GI consult -IV Protonix 40 mg twice daily - Pending EGD  ##) Iron deficiency anemia: Mildly anemic today.  We will hold on checking iron panel at this time or starting supplements as her hemoglobin is improved dramatically from 1 year ago with no intervention.  Fluids: IV fluids  Elect lites: Monitor and supplement  Nutrition: N.p.o. pending procedure  Prophylaxis: Ambulatory  Disposition: Pending EGD  Full code   Cristy Folks MD [facility]   If 7PM-7AM, please contact night-coverage [email or web address] Password [facility]  11/21/2017,  12:41 AM

## 2017-11-22 DIAGNOSIS — K289 Gastrojejunal ulcer, unspecified as acute or chronic, without hemorrhage or perforation: Secondary | ICD-10-CM

## 2017-11-22 DIAGNOSIS — Z9884 Bariatric surgery status: Secondary | ICD-10-CM

## 2017-11-22 MED ORDER — PANTOPRAZOLE SODIUM 40 MG PO TBEC: DELAYED_RELEASE_TABLET | ORAL | 0 refills | Status: DC

## 2017-11-22 NOTE — Progress Notes (Signed)
Pt was discharged home today. Instructions were reviewed with patient, and questions were answered. Pt walked to main entrance with NT.  

## 2017-11-22 NOTE — Discharge Summary (Signed)
Physician Discharge Summary  Patricia Wise ZTI:[id number] DOB: 05/31/1981 DOA: 11/20/2017  PCP: Patient, No Pcp Per  Admit date: 11/20/2017 Discharge date: 11/22/2017   Recommendations for Outpatient Follow-Up:   1. hpylori pending 2. Patient moving to [place] and will establish with PCP And GI there 3. Needs repeat EGD in a few months 4. PPI BID x 6 weeks then daily   Discharge Diagnosis:   Active Problems:   H/O gastric bypass   Abdominal pain   Jejunal ulcer   PUD (peptic ulcer disease)   Discharge disposition:  Home.   Discharge Condition: Improved.  Diet recommendation:   Wound care: None.   History of Present Illness:   Patricia Wise is a 37 y.o. female with medical history significant of Roux-en-Y gastric bypass in 8250 complicated by iron deficiency anemia, marginal ulcers distal to the gastrojejunal anastomosis who comes in with severe epigastric pain for several days.  Patient reports that she has been having severe epigastric pain that is sharp.  Pain waxes and wanes.  Pain radiates across the upper portion of her abdomen.  She reports that eating and drinking makes the pain significantly worse.  She denies any nausea, vomiting, diarrhea.  She denies any fevers, chest pain, rashes, dysuria, hematuria, vaginal discharge. Patient was admitted 08/16/2017 and had an EGD done that showed large ulcers just distal to the gastrojejunal anastomosis.  GI recommended starting outpatient Protonix twice daily and following up with GI as an outpatient.  Patient did not follow-up as an outpatient did not take Protonix twice daily.  Patient's symptoms had initially resolved on their own.     Hospital Course by Problem:   Gastric ulcer -GI consult appreciated Did not take protonix as prescribed  h pylori pending  Fe def anemia -outpatient follow up   Medical Consultants:    GI   Discharge Exam:   Vitals:   11/21/17 2102 11/22/17 0543  BP: (!) 106/54 100/75    Pulse: 89 63  Resp: 18 18  Temp: 98.7 F (37.1 C) 98.1 F (36.7 C)  SpO2: 100% 100%   Vitals:   11/21/17 0605 11/21/17 1335 11/21/17 2102 11/22/17 0543  BP: (!) 99/56 109/66 (!) 106/54 100/75  Pulse: 64 89 89 63  Resp: 18 (!) 49 18 18  Temp: 98 F (36.7 C) 98.5 F (36.9 C) 98.7 F (37.1 C) 98.1 F (36.7 C)  TempSrc: Oral Oral Oral Oral  SpO2: 99% 100% 100% 100%  Weight:      Height:        Gen:  NAD-- ready to be d/c'd   The results of significant diagnostics from this hospitalization (including imaging, microbiology, ancillary and laboratory) are listed below for reference.     Procedures and Diagnostic Studies:   Ct Abdomen Pelvis W Contrast  Result Date: 11/20/2017 CLINICAL DATA:  Acute abdominal pain.  Upper abdominal pain. EXAM: CT ABDOMEN AND PELVIS WITH CONTRAST TECHNIQUE: Multidetector CT imaging of the abdomen and pelvis was performed using the standard protocol following bolus administration of intravenous contrast. CONTRAST:  100 cc Isovue-300 IV ISOVUE-300 IOPAMIDOL (ISOVUE-300) INJECTION 61% COMPARISON:  CT 08/15/2017 FINDINGS: Lower chest: Lung bases are clear. No pleural fluid or consolidation. Bilateral breast implants. Hepatobiliary: No focal hepatic lesion. Enlarged liver spanning 22 cm cranial caudal. Gallstone without gallbladder inflammation or biliary dilatation. Pancreas: No ductal dilatation or inflammation. Spleen: Normal in size without focal abnormality. Adrenals/Urinary Tract: Normal adrenal glands. No hydronephrosis or perinephric edema. Homogeneous renal enhancement. Urinary bladder is  near completely decompressed. Stomach/Bowel: Post gastric bypass. Mild wall thickening at the gastro jejunal anastomosis at site of prior outpouching, axial image 19 series 2. mild adjacent soft tissue inflammation demonstrated on coronal image 37 series 4. Minimal fluid in the excluded gastric remnant. No other bowel wall thickening or inflammation. No bowel  obstruction. Normal appendix. Moderate colonic stool burden. Mild fecalization of small bowel contents of pelvic small bowel. Vascular/Lymphatic: Normal caliber abdominal aorta. No acute vascular findings. No enlarged abdominal or pelvic lymph nodes. Reproductive: Uterus and bilateral adnexa are unremarkable. Small volume simple free fluid in the pelvis likely physiologic. Other: No upper abdominal ascites. No free air. No intra-abdominal abscess. Musculoskeletal: There are no acute or suspicious osseous abnormalities. IMPRESSION: 1. Post gastric bypass. Focal wall thickening and mild adjacent inflammation in the region of the gastro jejunal anastomosis, similar location to prior inflammatory change and suspicious for marginal ulcer. No frank perforation. 2. Cholelithiasis without gallbladder inflammation. Electronically Signed   By: Jeb Levering M.D.   On: 11/20/2017 22:29     Labs:   Basic Metabolic Panel: Recent Labs  Lab 11/20/17 1556 11/21/17 0608  NA 141 142  K 4.4 4.0  CL 106 109  CO2 28 26  GLUCOSE 108* 97  BUN 11 11  CREATININE 0.78 0.85  CALCIUM 8.9 8.4*   GFR Estimated Creatinine Clearance: 108.1 mL/min (by C-G formula based on SCr of 0.85 mg/dL). Liver Function Tests: Recent Labs  Lab 11/20/17 1556  AST 33  ALT 18  ALKPHOS 49  BILITOT 0.3  PROT 6.3*  ALBUMIN 3.7   Recent Labs  Lab 11/20/17 1556  LIPASE 28   No results for input(s): AMMONIA in the last 168 hours. Coagulation profile No results for input(s): INR, PROTIME in the last 168 hours.  CBC: Recent Labs  Lab 11/20/17 1556 11/21/17 0608  WBC 12.6* 7.8  HGB 11.2* 9.6*  HCT 34.9* 30.4*  MCV 85.5 85.9  PLT 337 258   Cardiac Enzymes: No results for input(s): CKTOTAL, CKMB, CKMBINDEX, TROPONINI in the last 168 hours. BNP: Invalid input(s): POCBNP CBG: No results for input(s): GLUCAP in the last 168 hours. D-Dimer No results for input(s): DDIMER in the last 72 hours. Hgb A1c Recent Labs     11/21/17 0608  HGBA1C 5.9*   Lipid Profile Recent Labs    11/21/17 0608  CHOL 136  HDL 57  LDLCALC 65  TRIG 71  CHOLHDL 2.4   Thyroid function studies No results for input(s): TSH, T4TOTAL, T3FREE, THYROIDAB in the last 72 hours.  Invalid input(s): FREET3 Anemia work up No results for input(s): VITAMINB12, FOLATE, FERRITIN, TIBC, IRON, RETICCTPCT in the last 72 hours. Microbiology No results found for this or any previous visit (from the past 240 hour(s)).   Discharge Instructions:   Discharge Instructions    Diet general   Complete by:  As directed    Discharge instructions   Complete by:  As directed    AVOID NSAIDS Will need EGD in [place] in 2-3 months h pylori pending Be sure to take protonix BID x 6 weeks then daily indefinitely   Increase activity slowly   Complete by:  As directed      Allergies as of 11/22/2017   No Known Allergies     Medication List    STOP taking these medications   metroNIDAZOLE 500 MG tablet Commonly known as:  FLAGYL     TAKE these medications   acetaminophen 500 MG tablet Commonly known as:  TYLENOL Take 1,000 mg by mouth every 6 (six) hours as needed for mild pain, moderate pain or headache.   ferrous sulfate 325 (65 FE) MG tablet Commonly known as:  FERROUSUL Take 1 tablet (325 mg total) by mouth 2 (two) times daily.   pantoprazole 40 MG tablet Commonly known as:  PROTONIX 40 mg BID x 6 weeks then daily there after What changed:    how much to take  how to take this  when to take this  additional instructions      Follow-up Information    establish with PCP And GI in [place] Follow up.            Time coordinating discharge: 35 min  Signed:  Geradine Girt   [facility] 11/22/2017, 9:19 AM

## 2017-11-23 LAB — H. PYLORI ANTIBODY, IGG: H Pylori IgG: <0.8 Index Value (ref 0.00–0.79)

## 2018-03-31 IMAGING — US US OB TRANSVAGINAL
1 series · 15 of 28 positions shown · non-contrast
Comparison: 02/15/2017

CLINICAL DATA: Excessive bleeding status post D&C

EXAM:
OBSTETRIC <14 WK US AND TRANSVAGINAL OB US
TECHNIQUE: Both transabdominal and transvaginal ultrasound examinations were
performed for complete evaluation of the gestation as well as the
maternal uterus, adnexal regions, and pelvic cul-de-sac.
Transvaginal technique was performed to assess early pregnancy.

[Series 1: us ob transvaginal · 31 acquisitions, 15 frames shown]
[im 1/31]
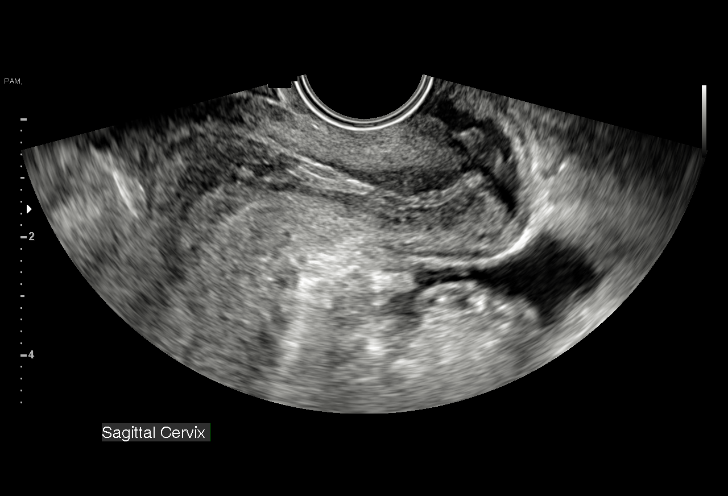
[im 3/31]
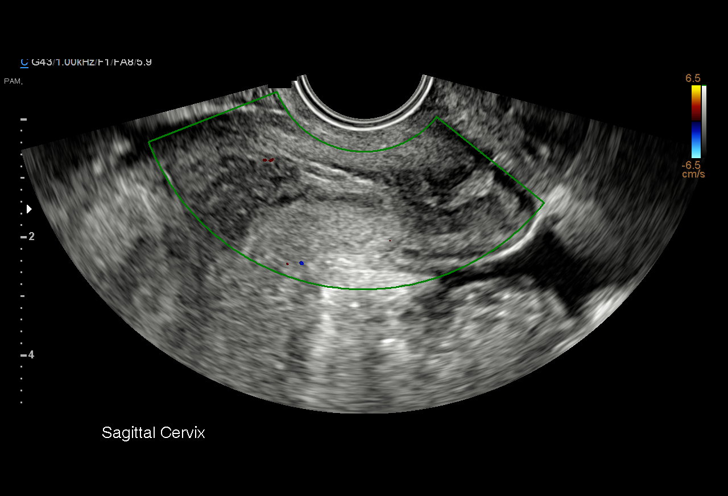
[im 5/31]
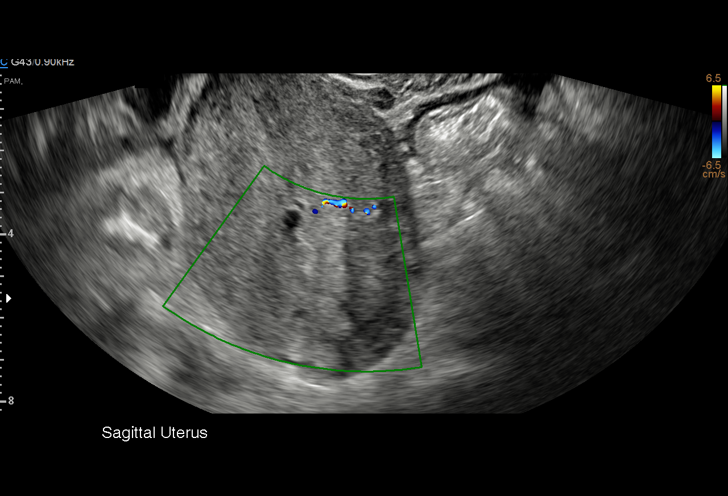
[im 7/31]
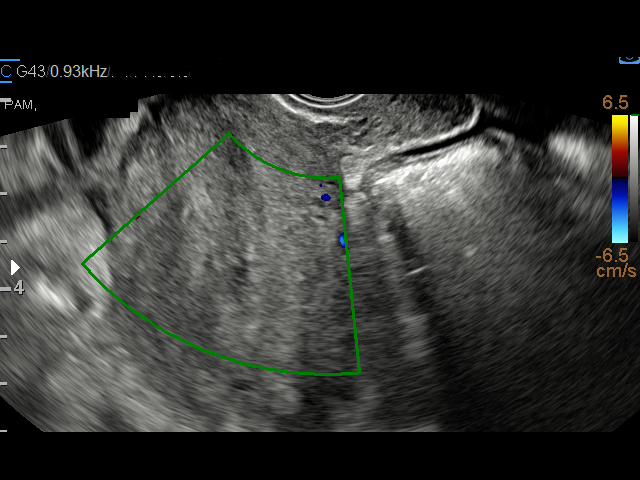
[im 9/31]
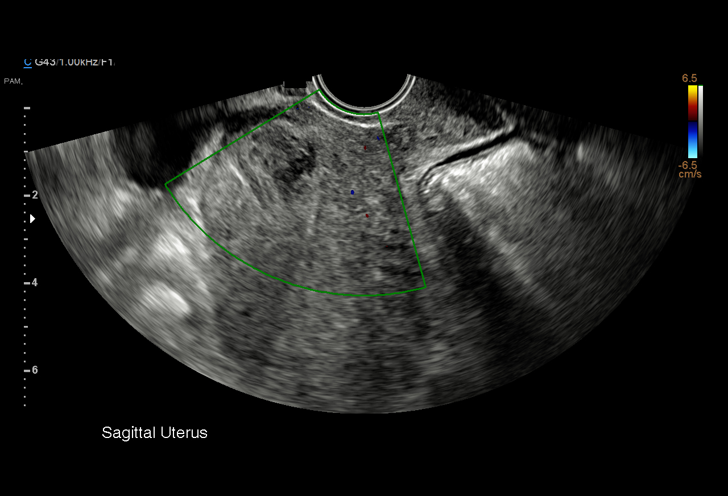
[im 12/31]
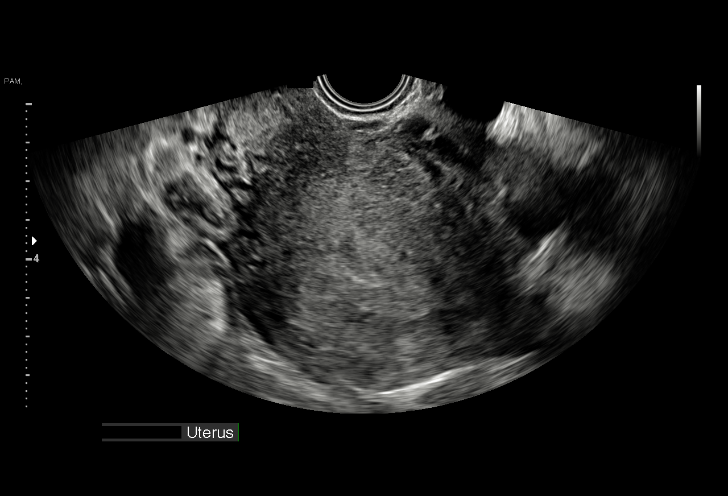
[im 14/31]
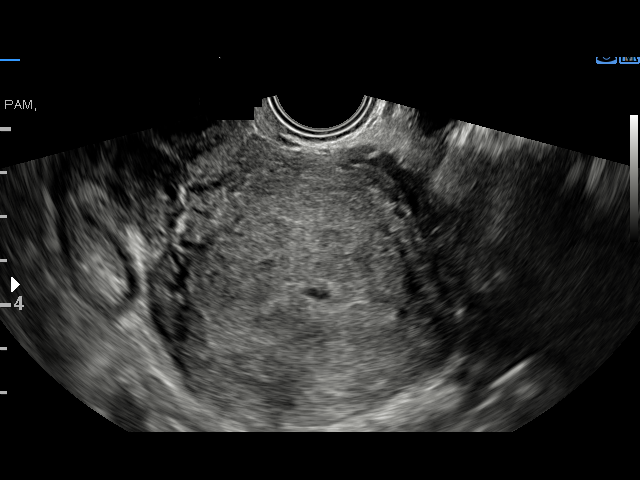
[im 16/31]
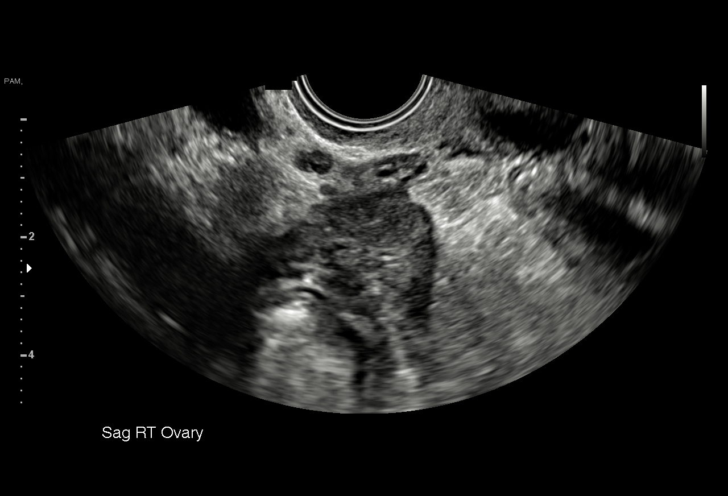
[im 17/31]
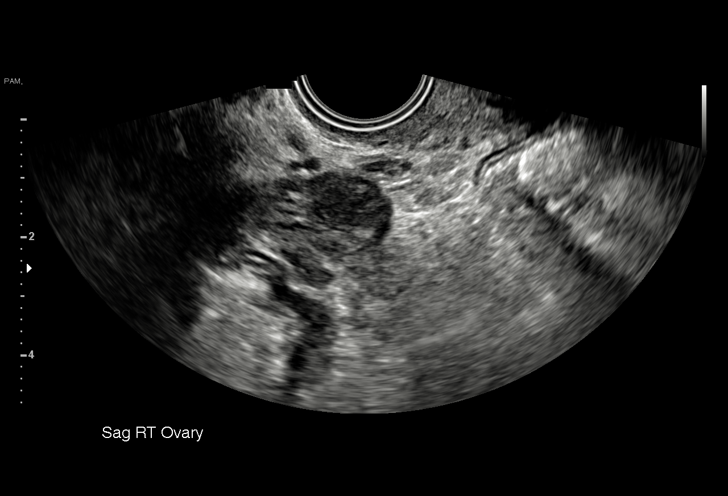
[im 19/31]
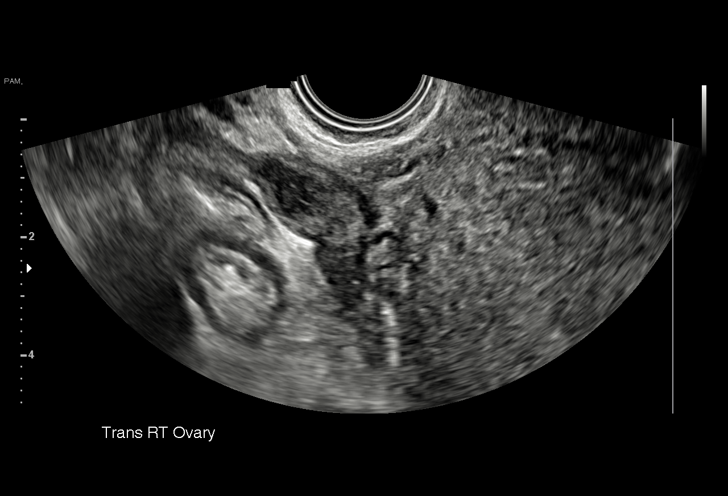
[im 22/31]
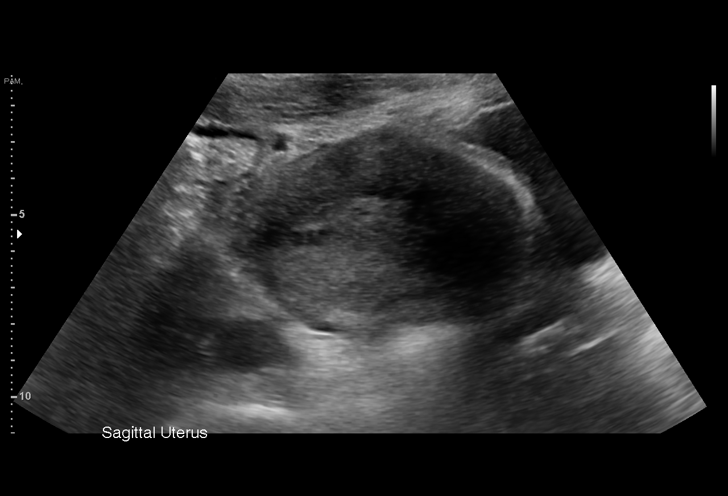
[im 24/31]
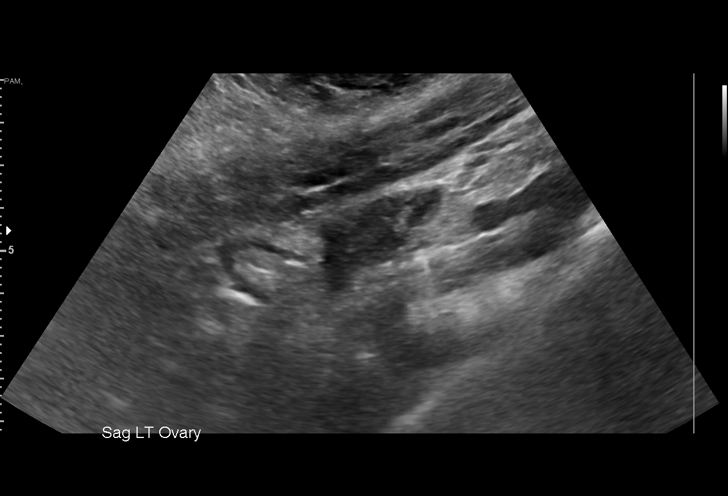
[im 26/31]
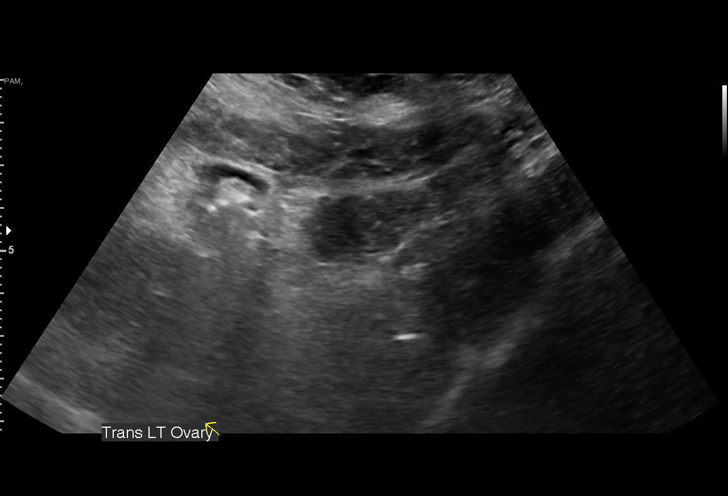
[im 28/31]
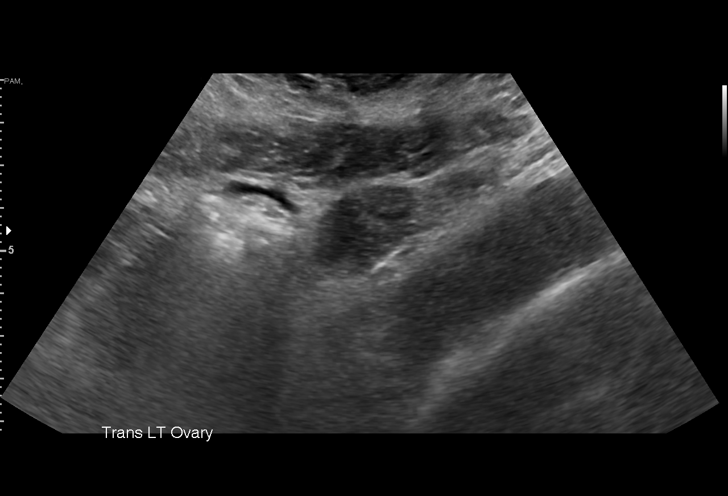
[im 31/31]
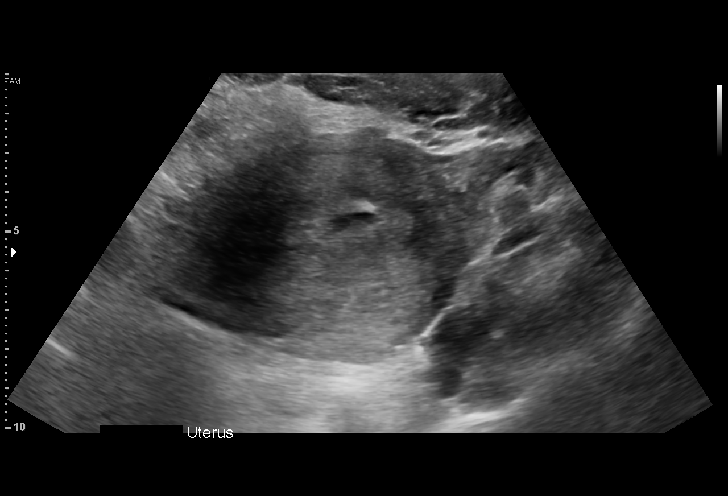

[15 of 28 positions shown; findings below may reference images not displayed]

FINDINGS: Intrauterine gestational sac: None

Maternal uterus/adnexae: Endometrial complex measures 11 mm, mildly
thickened, better evaluated on transabdominal imaging. Mild
associated vascularity (image 12). Trace fluid in the uterine
fundus.

Bilateral ovaries are within normal limits.

Trace pelvic fluid.
IMPRESSION: No IUP is visualized status post D&C.

Endometrial complex mildly thickened, measuring 11 mm, with mild
vascularity. While equivocal, the appearance is considered worrisome
for retained products of conception in the appropriate clinical
setting.

## 2019-06-21 ENCOUNTER — Telehealth: Payer: Self-pay | Admitting: Obstetrics and Gynecology

## 2019-06-21 NOTE — Telephone Encounter (Signed)
Called the patient to inform of the upcoming visit. Received a message the person you are trying to reach has a voicemail box that has not been setup yet. Good-bye.

## 2019-06-22 ENCOUNTER — Encounter: Payer: Self-pay | Admitting: Obstetrics and Gynecology

## 2019-06-22 ENCOUNTER — Other Ambulatory Visit: Payer: Self-pay

## 2019-06-22 ENCOUNTER — Other Ambulatory Visit (HOSPITAL_COMMUNITY)
Admission: RE | Admit: 2019-06-22 | Discharge: 2019-06-22 | Disposition: A | Discharge status: Home / Self Care | Payer: Medicaid Other | Source: Ambulatory Visit | Attending: Obstetrics and Gynecology | Admitting: Obstetrics and Gynecology

## 2019-06-22 ENCOUNTER — Ambulatory Visit (INDEPENDENT_AMBULATORY_CARE_PROVIDER_SITE_OTHER): Payer: Medicaid Other | Admitting: Obstetrics and Gynecology

## 2019-06-22 VITALS — BP 101/66 | HR 73 | Wt 230.4 lb

## 2019-06-22 DIAGNOSIS — Z124 Encounter for screening for malignant neoplasm of cervix: Secondary | ICD-10-CM

## 2019-06-22 DIAGNOSIS — Z113 Encounter for screening for infections with a predominantly sexual mode of transmission: Secondary | ICD-10-CM | POA: Insufficient documentation

## 2019-06-22 DIAGNOSIS — Z0000 Encounter for general adult medical examination without abnormal findings: Secondary | ICD-10-CM | POA: Diagnosis not present

## 2019-06-22 DIAGNOSIS — Z01419 Encounter for gynecological examination (general) (routine) without abnormal findings: Secondary | ICD-10-CM | POA: Diagnosis not present

## 2019-06-22 DIAGNOSIS — N898 Other specified noninflammatory disorders of vagina: Secondary | ICD-10-CM

## 2019-06-22 DIAGNOSIS — N920 Excessive and frequent menstruation with regular cycle: Secondary | ICD-10-CM

## 2019-06-22 NOTE — Patient Instructions (Signed)
Levonorgestrel intrauterine device (IUD) What is this medicine? LEVONORGESTREL IUD (LEE voe nor jes trel) is a contraceptive (birth control) device. The device is placed inside the uterus by a healthcare professional. It is used to prevent pregnancy. This device can also be used to treat heavy bleeding that occurs during your period. This medicine may be used for other purposes; ask your health care provider or pharmacist if you have questions. COMMON BRAND NAME(S): Kyleena, LILETTA, Mirena, Skyla What should I tell my health care provider before I take this medicine? They need to know if you have any of these conditions:  abnormal Pap smear  cancer of the breast, uterus, or cervix  diabetes  endometritis  genital or pelvic infection now or in the past  have more than one sexual partner or your partner has more than one partner  heart disease  history of an ectopic or tubal pregnancy  immune system problems  IUD in place  liver disease or tumor  problems with blood clots or take blood-thinners  seizures  use intravenous drugs  uterus of unusual shape  vaginal bleeding that has not been explained  an unusual or allergic reaction to levonorgestrel, other hormones, silicone, or polyethylene, medicines, foods, dyes, or preservatives  pregnant or trying to get pregnant  breast-feeding How should I use this medicine? This device is placed inside the uterus by a health care professional. Talk to your pediatrician regarding the use of this medicine in children. Special care may be needed. Overdosage: If you think you have taken too much of this medicine contact a poison control center or emergency room at once. NOTE: This medicine is only for you. Do not share this medicine with others. What if I miss a dose? This does not apply. Depending on the brand of device you have inserted, the device will need to be replaced every 3 to 6 years if you wish to continue using this type  of birth control. What may interact with this medicine? Do not take this medicine with any of the following medications:  amprenavir  bosentan  fosamprenavir This medicine may also interact with the following medications:  aprepitant  armodafinil  barbiturate medicines for inducing sleep or treating seizures  bexarotene  boceprevir  griseofulvin  medicines to treat seizures like carbamazepine, ethotoin, felbamate, oxcarbazepine, phenytoin, topiramate  modafinil  pioglitazone  rifabutin  rifampin  rifapentine  some medicines to treat HIV infection like atazanavir, efavirenz, indinavir, lopinavir, nelfinavir, tipranavir, ritonavir  St. John's wort  warfarin This list may not describe all possible interactions. Give your health care provider a list of all the medicines, herbs, non-prescription drugs, or dietary supplements you use. Also tell them if you smoke, drink alcohol, or use illegal drugs. Some items may interact with your medicine. What should I watch for while using this medicine? Visit your doctor or health care professional for regular check ups. See your doctor if you or your partner has sexual contact with others, becomes HIV positive, or gets a sexual transmitted disease. This product does not protect you against HIV infection (AIDS) or other sexually transmitted diseases. You can check the placement of the IUD yourself by reaching up to the top of your vagina with clean fingers to feel the threads. Do not pull on the threads. It is a good habit to check placement after each menstrual period. Call your doctor right away if you feel more of the IUD than just the threads or if you cannot feel the threads at   all. The IUD may come out by itself. You may become pregnant if the device comes out. If you notice that the IUD has come out use a backup birth control method like condoms and call your health care provider. Using tampons will not change the position of the  IUD and are okay to use during your period. This IUD can be safely scanned with magnetic resonance imaging (MRI) only under specific conditions. Before you have an MRI, tell your healthcare provider that you have an IUD in place, and which type of IUD you have in place. What side effects may I notice from receiving this medicine? Side effects that you should report to your doctor or health care professional as soon as possible:  allergic reactions like skin rash, itching or hives, swelling of the face, lips, or tongue  fever, flu-like symptoms  genital sores  high blood pressure  no menstrual period for 6 weeks during use  pain, swelling, warmth in the leg  pelvic pain or tenderness  severe or sudden headache  signs of pregnancy  stomach cramping  sudden shortness of breath  trouble with balance, talking, or walking  unusual vaginal bleeding, discharge  yellowing of the eyes or skin Side effects that usually do not require medical attention (report to your doctor or health care professional if they continue or are bothersome):  acne  breast pain  change in sex drive or performance  changes in weight  cramping, dizziness, or faintness while the device is being inserted  headache  irregular menstrual bleeding within first 3 to 6 months of use  nausea This list may not describe all possible side effects. Call your doctor for medical advice about side effects. You may report side effects to FDA at 1-800-FDA-1088. Where should I keep my medicine? This does not apply. NOTE: This sheet is a summary. It may not cover all possible information. If you have questions about this medicine, talk to your doctor, pharmacist, or health care provider.  2020 Elsevier/Gold Standard (2018-08-01 13:22:01)  

## 2019-06-22 NOTE — Progress Notes (Signed)
GYNECOLOGY ANNUAL PREVENTATIVE CARE ENCOUNTER NOTE  Subjective:   Patricia Wise is a 38 y.o. [phone] female here for a annual gynecologic exam. Current complaints: h/o abnormal pap. Monthly periods, periods are heavier, they are more painful. This has been going on since her miscarriage in 2018. Has not used any hormonal method to control bleeding. Denies discharge, pelvic pain, problems with intercourse or other gynecologic concerns.   Desires STI screen. Declines contraception today. Is interested in treatment for her heavy periods.  Declines flu shot.   Gynecologic History Patient's last menstrual period was 05/29/2019. Contraception: condoms Last Pap: last year in [place], reports abnormal Last mammogram: n/a  Obstetric History OB History  Gravida Para Term Preterm AB Living  3 2 1 1 1 2   SAB TAB Ectopic Multiple Live Births  1     0 2    # Outcome Date GA Lbr Len/2nd Weight Sex Delivery Anes PTL Lv  3 Term 08/14/15 [redacted]w[redacted]d  7 lb 10.1 oz (3.46 kg) F CS-LTranv Spinal  LIV  2 Preterm 07/15/07 [redacted]w[redacted]d  6 lb 5 oz (2.863 kg) F CS-LTranv Spinal  LIV  1 SAB             Past Medical History:  Diagnosis Date  . BV (bacterial vaginosis) 07/2015   also candidial vaginosis 03/2017  . Fatty liver 05/2017   noted along with gallstone in neck of GB on CT 08/15/17, LFTs normal at that time.   . Morbid obesity ([place]) 2014   s/p gastric bypass in [place].    Herold Harms   . Normocytic anemia 02/2017  . Preterm labor    07/2007  . Vaginal Pap smear, abnormal     Past Surgical History:  Procedure Laterality Date  . BREAST SURGERY     augmentation  . CESAREAN SECTION    . CESAREAN SECTION N/A 08/14/2015    REPEAT CESAREAN SECTION;  Surgeon: Donnamae Jude, MD;   . DILATION AND CURETTAGE OF UTERUS    . DILATION AND EVACUATION N/A 02/18/2017   for missed Ab.   . ESOPHAGOGASTRODUODENOSCOPY (EGD) WITH PROPOFOL N/A 08/16/2017   Procedure: ESOPHAGOGASTRODUODENOSCOPY (EGD) WITH PROPOFOL;   Surgeon: Irene Shipper, MD;  Location: [facility];  Service: Endoscopy;  Laterality: N/A;  . GASTRIC BYPASS  2013 vs 2014   in [place]  . TONSILLECTOMY    . WRIST SURGERY Bilateral    carpal tunnel    No current outpatient medications on file prior to visit.   No current facility-administered medications on file prior to visit.     No Known Allergies  Social History   Socioeconomic History  . Marital status: Single    Spouse name: Not on file  . Number of children: Not on file  . Years of education: Not on file  . Highest education level: Not on file  Occupational History  . Not on file  Social Needs  . Financial resource strain: Not on file  . Food insecurity    Worry: Not on file    Inability: Not on file  . Transportation needs    Medical: Not on file    Non-medical: Not on file  Tobacco Use  . Smoking status: Former Smoker    Quit date: 08/04/2012    Years since quitting: 6.8  . Smokeless tobacco: Never Used  Substance and Sexual Activity  . Alcohol use: Yes    Comment: OCCASIONAL  . Drug use: No  . Sexual activity:  Yes    Birth control/protection: None  Lifestyle  . Physical activity    Days per week: Not on file    Minutes per session: Not on file  . Stress: Not on file  Relationships  . Social Herbalist on phone: Not on file    Gets together: Not on file    Attends religious service: Not on file    Active member of club or organization: Not on file    Attends meetings of clubs or organizations: Not on file    Relationship status: Not on file  . Intimate partner violence    Fear of current or ex partner: Not on file    Emotionally abused: Not on file    Physically abused: Not on file    Forced sexual activity: Not on file  Other Topics Concern  . Not on file  Social History Narrative  . Not on file    Family History  Problem Relation Age of Onset  . Cancer Mother    The following portions of the patient's history were reviewed  and updated as appropriate: allergies, current medications, past family history, past medical history, past social history, past surgical history and problem list.  Review of Systems Pertinent items are noted in HPI.   Objective:  BP 101/66   Pulse 73   Wt 230 lb 6.4 oz (104.5 kg)   LMP 05/29/2019   BMI 37.19 kg/m  CONSTITUTIONAL: Well-developed, well-nourished female in no acute distress.  HENT:  Normocephalic, atraumatic, External right and left ear normal. Oropharynx is clear and moist EYES: Conjunctivae and EOM are normal. Pupils are equal, round, and reactive to light. No scleral icterus.  NECK: Normal range of motion, supple, no masses.  Normal thyroid.  SKIN: Skin is warm and dry. No rash noted. Not diaphoretic. No erythema. No pallor. NEUROLOGIC: Alert and oriented to person, place, and time. Normal reflexes, muscle tone coordination. No cranial nerve deficit noted. PSYCHIATRIC: Normal mood and affect. Normal behavior. Normal judgment and thought content. CARDIOVASCULAR: Normal heart rate noted, regular rhythm RESPIRATORY: Clear to auscultation bilaterally. Effort and breath sounds normal, no problems with respiration noted. BREASTS: Symmetric in size. No masses, skin changes, nipple drainage, or lymphadenopathy. Well healed scars from augmentation ABDOMEN: Soft, normal bowel sounds, no distention noted.  No tenderness, rebound or guarding.  PELVIC: Normal appearing external genitalia; normal appearing vaginal mucosa and cervix.  No abnormal discharge noted.  Pap smear obtained.  Normal uterine size, no other palpable masses, no uterine or adnexal tenderness. MUSCULOSKELETAL: Normal range of motion. No tenderness.  No cyanosis, clubbing, or edema.  2+ distal pulses.  Exam done with chaperone present.  Assessment and Plan:   1. Well woman exam with routine gynecological exam - US PELVIC COMPLETE WITH TRANSVAGINAL; Future - TSH - Cytology - PAP( [facility])  2. Menorrhagia  with regular cycle Reviewed options for treatment, she will consider IUD - US PELVIC COMPLETE WITH TRANSVAGINAL; Future  3. Cervical cancer screening Pap today  4. Routine screening for STI (sexually transmitted infection) - Cervicovaginal ancillary only( [facility]) - HIV antibody (with reflex) - Hepatitis B Surface AntiGEN - Hepatitis C Antibody - RPR  5. Vaginal discharge - Cervicovaginal ancillary only( [facility])   Will follow up results of pap smear/STI screen and manage accordingly. Encouraged improvement in diet and exercise.  Declines flu shot.  Routine preventative health maintenance measures emphasized. Please refer to After Visit Summary for other counseling recommendations.  Total face-to-face time with patient: 30 minutes. Over 50% of encounter was spent on counseling and coordination of care.   Feliz Beam, M.D. Attending Center for [employer] [facility] manager)

## 2019-06-23 LAB — HIV ANTIBODY (ROUTINE TESTING W REFLEX): HIV Screen 4th Generation wRfx: NONREACTIVE

## 2019-06-23 LAB — HEPATITIS B SURFACE ANTIGEN: Hepatitis B Surface Ag: NEGATIVE

## 2019-06-23 LAB — TSH: TSH: 1.98 u[IU]/mL (ref 0.450–4.500)

## 2019-06-23 LAB — HEPATITIS C ANTIBODY: Hep C Virus Ab: <0.1 s/co ratio (ref 0.0–0.9)

## 2019-06-23 LAB — RPR: RPR Ser Ql: NONREACTIVE

## 2019-06-25 LAB — CERVICOVAGINAL ANCILLARY ONLY
Bacterial Vaginitis (gardnerella): POSITIVE — AB
Candida Glabrata: POSITIVE — AB
Candida Vaginitis: NEGATIVE
Molecular Disclaimer: NEGATIVE
Molecular Disclaimer: NEGATIVE
Molecular Disclaimer: NEGATIVE
Molecular Disclaimer: NORMAL
Trichomonas: NEGATIVE

## 2019-06-26 LAB — CYTOLOGY - PAP
Chlamydia: NEGATIVE
Diagnosis: NEGATIVE
High risk HPV: NEGATIVE
Molecular Disclaimer: 56
Molecular Disclaimer: DETECTED
Molecular Disclaimer: NEGATIVE
Molecular Disclaimer: NORMAL
Neisseria Gonorrhea: NEGATIVE

## 2019-06-29 MED ORDER — METRONIDAZOLE 500 MG PO TABS: 500.0000 mg | ORAL_TABLET | Freq: Two times a day (BID) | ORAL | 0 refills | Status: DC

## 2019-06-29 MED ORDER — FLUCONAZOLE 150 MG PO TABS: 150.0000 mg | ORAL_TABLET | Freq: Once | ORAL | 1 refills | Status: AC

## 2019-06-29 NOTE — Addendum Note (Signed)
Addended by: Vivien Rota on: 06/29/2019 12:42 PM   Modules accepted: Orders

## 2019-07-02 ENCOUNTER — Ambulatory Visit (HOSPITAL_COMMUNITY)
Admission: RE | Admit: 2019-07-02 | Discharge: 2019-07-02 | Disposition: A | Discharge status: Home / Self Care | Payer: Medicaid Other | Source: Ambulatory Visit | Attending: Obstetrics and Gynecology | Admitting: Obstetrics and Gynecology

## 2019-07-02 ENCOUNTER — Other Ambulatory Visit: Payer: Self-pay

## 2019-07-02 DIAGNOSIS — N920 Excessive and frequent menstruation with regular cycle: Secondary | ICD-10-CM | POA: Insufficient documentation

## 2019-07-02 DIAGNOSIS — Z01419 Encounter for gynecological examination (general) (routine) without abnormal findings: Secondary | ICD-10-CM | POA: Diagnosis not present

## 2019-07-11 ENCOUNTER — Other Ambulatory Visit: Payer: Self-pay

## 2019-07-11 ENCOUNTER — Observation Stay (HOSPITAL_COMMUNITY)
Admission: EM | Admit: 2019-07-11 | Discharge: 2019-07-13 | Disposition: A | Discharge status: Home / Self Care | Payer: Medicaid Other | Attending: Internal Medicine | Admitting: Internal Medicine

## 2019-07-11 ENCOUNTER — Encounter (HOSPITAL_COMMUNITY): Payer: Self-pay | Admitting: Emergency Medicine

## 2019-07-11 DIAGNOSIS — K289 Gastrojejunal ulcer, unspecified as acute or chronic, without hemorrhage or perforation: Secondary | ICD-10-CM | POA: Diagnosis not present

## 2019-07-11 DIAGNOSIS — K3189 Other diseases of stomach and duodenum: Secondary | ICD-10-CM | POA: Diagnosis not present

## 2019-07-11 DIAGNOSIS — Z20828 Contact with and (suspected) exposure to other viral communicable diseases: Secondary | ICD-10-CM | POA: Diagnosis not present

## 2019-07-11 DIAGNOSIS — K2970 Gastritis, unspecified, without bleeding: Secondary | ICD-10-CM | POA: Diagnosis not present

## 2019-07-11 DIAGNOSIS — R1084 Generalized abdominal pain: Secondary | ICD-10-CM | POA: Diagnosis present

## 2019-07-11 DIAGNOSIS — R109 Unspecified abdominal pain: Secondary | ICD-10-CM | POA: Diagnosis present

## 2019-07-11 DIAGNOSIS — Z980 Intestinal bypass and anastomosis status: Secondary | ICD-10-CM | POA: Insufficient documentation

## 2019-07-11 DIAGNOSIS — Z87891 Personal history of nicotine dependence: Secondary | ICD-10-CM | POA: Insufficient documentation

## 2019-07-11 DIAGNOSIS — D72829 Elevated white blood cell count, unspecified: Secondary | ICD-10-CM | POA: Diagnosis not present

## 2019-07-11 DIAGNOSIS — Z9884 Bariatric surgery status: Secondary | ICD-10-CM | POA: Diagnosis not present

## 2019-07-11 DIAGNOSIS — K287 Chronic gastrojejunal ulcer without hemorrhage or perforation: Secondary | ICD-10-CM

## 2019-07-11 DIAGNOSIS — K279 Peptic ulcer, site unspecified, unspecified as acute or chronic, without hemorrhage or perforation: Secondary | ICD-10-CM | POA: Diagnosis present

## 2019-07-11 MED ORDER — SODIUM CHLORIDE 0.9% FLUSH
3.0000 mL | Freq: Once | INTRAVENOUS | Status: AC
  Administered 2019-07-12: 3 mL via INTRAVENOUS

## 2019-07-11 NOTE — ED Triage Notes (Signed)
C/o sharp upper abd pain and R shoulder pain x 1 week that is worse today.  Vomiting x 2 today.  Denies nausea and diarrhea.

## 2019-07-12 ENCOUNTER — Encounter (HOSPITAL_COMMUNITY): Payer: Self-pay | Admitting: Radiology

## 2019-07-12 ENCOUNTER — Emergency Department (HOSPITAL_COMMUNITY): Payer: Medicaid Other

## 2019-07-12 ENCOUNTER — Observation Stay (HOSPITAL_COMMUNITY): Payer: Medicaid Other | Admitting: Anesthesiology

## 2019-07-12 ENCOUNTER — Encounter (HOSPITAL_COMMUNITY)
Admission: EM | Disposition: A | Discharge status: Home / Self Care | Payer: Self-pay | Source: Home / Self Care | Attending: Emergency Medicine

## 2019-07-12 DIAGNOSIS — K2970 Gastritis, unspecified, without bleeding: Secondary | ICD-10-CM | POA: Diagnosis not present

## 2019-07-12 DIAGNOSIS — R109 Unspecified abdominal pain: Secondary | ICD-10-CM

## 2019-07-12 DIAGNOSIS — K279 Peptic ulcer, site unspecified, unspecified as acute or chronic, without hemorrhage or perforation: Secondary | ICD-10-CM | POA: Diagnosis not present

## 2019-07-12 DIAGNOSIS — D72829 Elevated white blood cell count, unspecified: Secondary | ICD-10-CM | POA: Diagnosis not present

## 2019-07-12 DIAGNOSIS — Z9884 Bariatric surgery status: Secondary | ICD-10-CM | POA: Diagnosis not present

## 2019-07-12 DIAGNOSIS — K3189 Other diseases of stomach and duodenum: Secondary | ICD-10-CM | POA: Diagnosis not present

## 2019-07-12 DIAGNOSIS — K289 Gastrojejunal ulcer, unspecified as acute or chronic, without hemorrhage or perforation: Secondary | ICD-10-CM | POA: Diagnosis not present

## 2019-07-12 HISTORY — PX: ESOPHAGOGASTRODUODENOSCOPY (EGD) WITH PROPOFOL: SHX5813

## 2019-07-12 HISTORY — PX: BIOPSY: SHX5522

## 2019-07-12 LAB — URINALYSIS, ROUTINE W REFLEX MICROSCOPIC
Bilirubin Urine: NEGATIVE
Glucose, UA: NEGATIVE mg/dL
Hgb urine dipstick: NEGATIVE
Ketones, ur: NEGATIVE mg/dL
Leukocytes,Ua: NEGATIVE
Nitrite: NEGATIVE
Protein, ur: NEGATIVE mg/dL
Specific Gravity, Urine: 1.041 — ABNORMAL HIGH (ref 1.005–1.030)
pH: 5 (ref 5.0–8.0)

## 2019-07-12 LAB — COMPREHENSIVE METABOLIC PANEL
ALT: 18 U/L (ref 0–44)
AST: 26 U/L (ref 15–41)
Albumin: 3.6 g/dL (ref 3.5–5.0)
Alkaline Phosphatase: 38 U/L (ref 38–126)
Anion gap: 8 (ref 5–15)
BUN: 21 mg/dL — ABNORMAL HIGH (ref 6–20)
CO2: 27 mmol/L (ref 22–32)
Calcium: 9.1 mg/dL (ref 8.9–10.3)
Chloride: 109 mmol/L (ref 98–111)
Creatinine, Ser: 0.93 mg/dL (ref 0.44–1.00)
GFR calc Af Amer: >60 mL/min (ref >60)
GFR calc non Af Amer: >60 mL/min (ref >60)
Glucose, Bld: 90 mg/dL (ref 70–99)
Potassium: 4.2 mmol/L (ref 3.5–5.1)
Sodium: 144 mmol/L (ref 135–145)
Total Bilirubin: 0.3 mg/dL (ref 0.3–1.2)
Total Protein: 5.8 g/dL — ABNORMAL LOW (ref 6.5–8.1)

## 2019-07-12 LAB — I-STAT BETA HCG BLOOD, ED (MC, WL, AP ONLY): I-stat hCG, quantitative: <5 m[IU]/mL (ref <5)

## 2019-07-12 LAB — CBC
HCT: 43.3 % (ref 36.0–46.0)
Hemoglobin: 13.5 g/dL (ref 12.0–15.0)
MCH: 29.5 pg (ref 26.0–34.0)
MCHC: 31.2 g/dL (ref 30.0–36.0)
MCV: 94.7 fL (ref 80.0–100.0)
Platelets: 342 10*3/uL (ref 150–400)
RBC: 4.57 MIL/uL (ref 3.87–5.11)
RDW: 13.7 % (ref 11.5–15.5)
WBC: 10.9 10*3/uL — ABNORMAL HIGH (ref 4.0–10.5)
nRBC: 0 % (ref 0.0–0.2)

## 2019-07-12 LAB — SARS CORONAVIRUS 2 BY RT PCR (HOSPITAL ORDER, PERFORMED IN ~~LOC~~ HOSPITAL LAB): SARS Coronavirus 2: NEGATIVE

## 2019-07-12 LAB — LIPASE, BLOOD: Lipase: 36 U/L (ref 11–51)

## 2019-07-12 SURGERY — ESOPHAGOGASTRODUODENOSCOPY (EGD) WITH PROPOFOL
Anesthesia: Monitor Anesthesia Care

## 2019-07-12 MED ORDER — SODIUM CHLORIDE 0.9% FLUSH
3.0000 mL | Freq: Two times a day (BID) | INTRAVENOUS | Status: DC
  Administered 2019-07-12 – 2019-07-13 (×3): 3 mL via INTRAVENOUS

## 2019-07-12 MED ORDER — ENOXAPARIN SODIUM 40 MG/0.4ML ~~LOC~~ SOLN
40.0000 mg | Freq: Every day | SUBCUTANEOUS | Status: DC
  Administered 2019-07-12: 18:00:00 40 mg via SUBCUTANEOUS
  Filled 2019-07-12 (×2): qty 0.4

## 2019-07-12 MED ORDER — BUTAMBEN-TETRACAINE-BENZOCAINE 2-2-14 % EX AERO
INHALATION_SPRAY | CUTANEOUS | Status: DC | PRN
  Administered 2019-07-12: 2 via TOPICAL

## 2019-07-12 MED ORDER — SODIUM CHLORIDE 0.9 % IV SOLN
INTRAVENOUS | Status: DC
  Administered 2019-07-12 – 2019-07-13 (×3): via INTRAVENOUS

## 2019-07-12 MED ORDER — ALBUTEROL SULFATE (2.5 MG/3ML) 0.083% IN NEBU: 2.5000 mg | INHALATION_SOLUTION | Freq: Four times a day (QID) | RESPIRATORY_TRACT | Status: DC | PRN

## 2019-07-12 MED ORDER — ONDANSETRON HCL 4 MG PO TABS
4.0000 mg | ORAL_TABLET | Freq: Four times a day (QID) | ORAL | Status: DC | PRN
  Filled 2019-07-12: qty 1

## 2019-07-12 MED ORDER — MORPHINE SULFATE (PF) 4 MG/ML IV SOLN
4.0000 mg | Freq: Once | INTRAVENOUS | Status: AC
  Administered 2019-07-12: 05:00:00 4 mg via INTRAVENOUS
  Filled 2019-07-12: qty 1

## 2019-07-12 MED ORDER — PANTOPRAZOLE SODIUM 40 MG IV SOLR
40.0000 mg | Freq: Two times a day (BID) | INTRAVENOUS | Status: DC
  Administered 2019-07-12 – 2019-07-13 (×3): 40 mg via INTRAVENOUS
  Filled 2019-07-12 (×3): qty 40

## 2019-07-12 MED ORDER — PROPOFOL 500 MG/50ML IV EMUL
INTRAVENOUS | Status: DC | PRN
  Administered 2019-07-12: 100 ug/kg/min via INTRAVENOUS

## 2019-07-12 MED ORDER — PANTOPRAZOLE SODIUM 40 MG IV SOLR
40.0000 mg | Freq: Once | INTRAVENOUS | Status: AC
  Administered 2019-07-12: 40 mg via INTRAVENOUS
  Filled 2019-07-12: qty 40

## 2019-07-12 MED ORDER — IOHEXOL 300 MG/ML  SOLN
100.0000 mL | Freq: Once | INTRAMUSCULAR | Status: AC | PRN
  Administered 2019-07-12: 05:00:00 100 mL via INTRAVENOUS

## 2019-07-12 MED ORDER — PROPOFOL 10 MG/ML IV BOLUS
INTRAVENOUS | Status: DC | PRN
  Administered 2019-07-12: 30 mg via INTRAVENOUS
  Administered 2019-07-12: 20 mg via INTRAVENOUS

## 2019-07-12 MED ORDER — MORPHINE SULFATE (PF) 4 MG/ML IV SOLN
4.0000 mg | Freq: Once | INTRAVENOUS | Status: AC
  Administered 2019-07-12: 07:00:00 4 mg via INTRAVENOUS
  Filled 2019-07-12: qty 1

## 2019-07-12 MED ORDER — ACETAMINOPHEN 325 MG PO TABS
650.0000 mg | ORAL_TABLET | Freq: Four times a day (QID) | ORAL | Status: DC | PRN
  Administered 2019-07-12 – 2019-07-13 (×3): 650 mg via ORAL
  Filled 2019-07-12 (×3): qty 2

## 2019-07-12 MED ORDER — ONDANSETRON HCL 4 MG/2ML IJ SOLN
4.0000 mg | Freq: Four times a day (QID) | INTRAMUSCULAR | Status: DC | PRN
  Filled 2019-07-12: qty 2

## 2019-07-12 MED ORDER — ALUM & MAG HYDROXIDE-SIMETH 200-200-20 MG/5ML PO SUSP
30.0000 mL | Freq: Once | ORAL | Status: AC
  Administered 2019-07-12: 06:00:00 30 mL via ORAL
  Filled 2019-07-12: qty 30

## 2019-07-12 MED ORDER — SUCRALFATE 1 GM/10ML PO SUSP
1.0000 g | Freq: Once | ORAL | Status: AC
  Administered 2019-07-12: 08:00:00 1 g via ORAL
  Filled 2019-07-12: qty 10

## 2019-07-12 MED ORDER — SUCRALFATE 1 GM/10ML PO SUSP
1.0000 g | Freq: Four times a day (QID) | ORAL | Status: DC
  Administered 2019-07-12 – 2019-07-13 (×4): 1 g via ORAL
  Filled 2019-07-12 (×7): qty 10

## 2019-07-12 MED ORDER — MORPHINE SULFATE (PF) 2 MG/ML IV SOLN
2.0000 mg | INTRAVENOUS | Status: DC | PRN
  Administered 2019-07-12 (×4): 2 mg via INTRAVENOUS
  Filled 2019-07-12 (×4): qty 1

## 2019-07-12 MED ORDER — ACETAMINOPHEN 650 MG RE SUPP: 650.0000 mg | Freq: Four times a day (QID) | RECTAL | Status: DC | PRN

## 2019-07-12 MED ORDER — ONDANSETRON HCL 4 MG/2ML IJ SOLN
4.0000 mg | Freq: Once | INTRAMUSCULAR | Status: AC
  Administered 2019-07-12: 05:00:00 4 mg via INTRAVENOUS
  Filled 2019-07-12: qty 2

## 2019-07-12 MED ORDER — LIDOCAINE VISCOUS HCL 2 % MT SOLN
15.0000 mL | Freq: Once | OROMUCOSAL | Status: AC
  Administered 2019-07-12: 06:00:00 15 mL via ORAL
  Filled 2019-07-12: qty 15

## 2019-07-12 MED ORDER — SODIUM CHLORIDE 0.9 % IV BOLUS (SEPSIS)
1000.0000 mL | Freq: Once | INTRAVENOUS | Status: AC
  Administered 2019-07-12: 05:00:00 1000 mL via INTRAVENOUS

## 2019-07-12 SURGICAL SUPPLY — 15 items

## 2019-07-12 NOTE — ED Notes (Signed)
Pt signed consent and urinated and endo taking her to procedure.

## 2019-07-12 NOTE — Transfer of Care (Signed)
Immediate Anesthesia Transfer of Care Note  Patient: Patricia Wise  Procedure(s) Performed: ESOPHAGOGASTRODUODENOSCOPY (EGD) WITH PROPOFOL (N/A ) BIOPSY  Patient Location: Endoscopy Unit  Anesthesia Type:MAC  Level of Consciousness: awake, oriented and patient cooperative  Airway & Oxygen Therapy: Patient Spontanous Breathing and Patient connected to nasal cannula oxygen  Post-op Assessment: Report given to RN and Post -op Vital signs reviewed and stable  Post vital signs: Reviewed  Last Vitals:  Vitals Value Taken Time  BP 91/58 07/12/19 1109  Temp 36.4 C 07/12/19 1108  Pulse 77 07/12/19 1110  Resp 18 07/12/19 1110  SpO2 99 % 07/12/19 1110  Vitals shown include unvalidated device data.  Last Pain:  Vitals:   07/12/19 1108  TempSrc: Temporal  PainSc: 4          Complications: No apparent anesthesia complications

## 2019-07-12 NOTE — Anesthesia Preprocedure Evaluation (Signed)
Anesthesia Evaluation  Patient identified by MRN, date of birth, ID band Patient awake    Reviewed: Allergy & Precautions, NPO status , Patient's Chart, lab work & pertinent test results  Airway Mallampati: II  TM Distance: >3 FB Neck ROM: Full    Dental no notable dental hx. 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  Reproductive/Obstetrics                             Anesthesia Physical Anesthesia Plan  ASA: II  Anesthesia Plan: MAC   Post-op Pain Management:    Induction: Intravenous  PONV Risk Score and Plan: 2 and Propofol infusion and Treatment may vary due to age or medical condition  Airway Management Planned: Natural Airway  Additional Equipment:   Intra-op Plan:   Post-operative Plan:   Informed Consent: I have reviewed the patients History and Physical, chart, labs and discussed the procedure including the risks, benefits and alternatives for the proposed anesthesia with the patient or authorized representative who has indicated his/her understanding and acceptance.     Dental advisory given  Plan Discussed with: CRNA  Anesthesia Plan Comments:         Anesthesia Quick Evaluation

## 2019-07-12 NOTE — ED Provider Notes (Signed)
TIME SEEN: 4:54 AM  CHIEF COMPLAINT: Generalized abdominal pain  HPI: Patient is a 38 year old female with history of gastric bypass in 2014 in [place], cholelithiasis, previous ulcers who presents to the emergency department with complaints of severe upper abdominal pain for the past week that has worsened the past day.  She states that this feels similar to her previous ulcers.  Pain significantly worse after eating.  She has been taking ibuprofen 800 mg twice daily for the past 2 weeks for a tooth ache.  She states when her pain started a week ago she started taking omeprazole every day without any relief.  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  She states she has had nausea and vomiting but no coffee-ground emesis or hematemesis.  No hematochezia or melena.  No diarrhea.  No fever.  No dysuria, hematuria, vaginal bleeding or discharge.  States that she started googling her symptoms and began to be concerned that this could be her ulcer or her gallbladder.  ROS: See HPI Constitutional: no fever  Eyes: no drainage  ENT: no runny nose   Cardiovascular:  no chest pain  Resp: no SOB  GI:  vomiting GU: no dysuria Integumentary: no rash  Allergy: no hives  Musculoskeletal: no leg swelling  Neurological: no slurred speech ROS otherwise negative  PAST MEDICAL HISTORY/PAST SURGICAL HISTORY:  Past Medical History:  Diagnosis Date  . BV (bacterial vaginosis) 07/2015   also candidial vaginosis 03/2017  . Fatty liver 05/2017   noted along with gallstone in neck of GB on CT 08/15/17, LFTs normal at that time.   . Morbid obesity ([place]) 2014   s/p gastric bypass in [place].    Herold Harms   . Normocytic anemia 02/2017  . Preterm labor    07/2007  . Vaginal Pap smear, abnormal     MEDICATIONS:  Prior to Admission medications   Medication Sig Start Date End Date Taking? Authorizing Provider   metroNIDAZOLE (FLAGYL) 500 MG tablet Take 1 tablet (500 mg total) by mouth 2 (two) times daily. 06/29/19   Sloan Leiter, MD    ALLERGIES:  No Known Allergies  SOCIAL HISTORY:  Social History   Tobacco Use  . Smoking status: Former Smoker    Quit date: 08/04/2012    Years since quitting: 6.9  . Smokeless tobacco: Never Used  Substance Use Topics  . Alcohol use: Yes    Comment: OCCASIONAL    FAMILY HISTORY: Family History  Problem Relation Age of Onset  . Cancer Mother     EXAM: BP 108/74 (BP Location: Right Arm)   Pulse 65   Temp 98.4 F (36.9 C) (Oral)   Resp 14   LMP 06/20/2019   SpO2 100%  CONSTITUTIONAL: Alert and oriented and responds appropriately to questions. Well-appearing; well-nourished HEAD: Normocephalic EYES: Conjunctivae clear, pupils appear equal, EOMI ENT: normal nose; moist mucous membranes NECK: Supple, no meningismus, no nuchal rigidity, no LAD  CARD: RRR; S1 and S2 appreciated; no murmurs, no clicks, no rubs, no gallops RESP: Normal chest excursion without splinting or tachypnea; breath sounds clear and equal bilaterally; no wheezes, no rhonchi, no rales, no hypoxia or respiratory distress, speaking full sentences ABD/GI: Normal bowel sounds; non-distended; soft, tender in the epigastric region, left upper quadrant and left lower quadrant, no rebound, no guarding, no peritoneal signs, no hepatosplenomegaly, no tenderness at McBurney's point, very minimally  tender in the right upper quadrant with a negative Murphy sign BACK:  The back appears normal and is non-tender to palpation, there is no CVA tenderness EXT: Normal ROM in all joints; non-tender to palpation; no edema; normal capillary refill; no cyanosis, no calf tenderness or swelling    SKIN: Normal color for age and race; warm; no rash NEURO: Moves all extremities equally PSYCH: The patient's mood and manner are appropriate. Grooming and personal hygiene are appropriate.  MEDICAL DECISION  MAKING: Patient here with complaints of generalized abdominal pain, vomiting.  Has had previous admission and upper endoscopy November 2018 performed by Dr. Scarlette Shorts that showed a large ulcer distal to the gastrojejunal anastomosis that was nonbleeding.  Patient was started at that time on Protonix 40 mg twice daily.  Patient then readmitted to the hospital in 2019 for similar symptoms.  At that time an EGD was not repeated but they started her back on Protonix 40 mg twice a day with plan for repeat EGD in 2 to 3 months after continuous PPI use.  Patient moved to [place] at that time and did not follow-up as recommended.  She is now here with return epigastric and generalized abdominal pain that is somewhat similar to her previous ulcers.  She is not actively bleeding.  She has a nonsurgical abdomen.  Less likely perforation.  Will give IV Protonix here in the emergency department.  Will obtain CT of the abdomen pelvis to rule out other pathologies including cholelithiasis, pancreatitis, perforation, bowel obstruction.    Patient's labs unremarkable other than leukocytosis of 12.9.  Urine shows no sign of infection or dehydration.  Pregnancy test negative.  EKG unremarkable.  Doubt ACS, PE, dissection.  Patient is requesting admission to the hospital and repeat endoscopy.  She is also upset that she waited for 5 hours and 35 minutes prior to being seen.  Apologized to patient for her significant wait time.  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  She has cholelithiasis without cholecystitis.  Pain is still a 7/10 after GI cocktail, Protonix, morphine.  Will give second dose of morphine.  Patient has been admitted twice for the symptoms previously.  Will discuss with GI on call for recommendations.   6:30 AM  Discussed case with Dr. Bryan Lemma on-call for  [facility].  Appreciate his help.  He recommends medical admission given patient is still so symptomatic.  Agrees with IV Protonix 40 mg twice daily and recommends liquid Carafate 1 g 4 times daily.  Will obtain rapid COVID swab as patient will likely need another EGD today.  We will keep her n.p.o.  He states patient could potentially need surgical intervention for this chronic ulcer.  I have had lengthy discussion with patient that she should avoid all NSAIDs in the future completely.  Will discuss with medicine for admission.  She does not have a local PCP.   6:46 AM Discussed patient's case with hospitalist, Dr. Myna Hidalgo.  I have recommended admission and patient (and family if present) agree with this plan. Admitting physician will place admission orders.   I reviewed all nursing notes, vitals, pertinent previous records, EKGs, lab and urine results, imaging (as available).     EKG Interpretation  Date/Time:  Wednesday July 11 2019 23:23:56 EDT Ventricular Rate:  80 PR Interval:  146 QRS Duration: 66 QT Interval:  346 QTC Calculation:  399 R Axis:   72 Text Interpretation:  Normal sinus rhythm Low voltage QRS Septal infarct , age undetermined Abnormal ECG No old tracing to compare Confirmed by Shadow Stiggers, Cyril Mourning [phone]) on 07/12/2019 5:07:11 AM       Kathleene Hazel was evaluated in Emergency Department on 07/12/2019 for the symptoms described in the history of present illness. She was evaluated in the context of the global COVID-19 pandemic, which necessitated consideration that the patient might be at risk for infection with the SARS-CoV-2 virus that causes COVID-19. Institutional protocols and algorithms that pertain to the evaluation of patients at risk for COVID-19 are in a state of rapid change based on information released by regulatory bodies including the CDC and federal and state organizations. These policies and algorithms were followed during the patient's care in the ED.    Schon Zeiders,  Delice Bison, DO 07/12/19 [phone]

## 2019-07-12 NOTE — ED Notes (Signed)
Report given to [place] RN after I confirmed with Dr. Tamala Julian pt was to be admitted per pt request.

## 2019-07-12 NOTE — H&P (Signed)
History and Physical    Patricia Wise [id number] DOB: Sep 20, 1981 DOA: 07/11/2019  Referring MD/NP/PA: Ruthy Dick, MD  PCP: Patient, No Pcp Per  Patient coming from: Home  Chief Complaint: Abdominal pain  I have personally briefly reviewed patient's old medical records in [place]   HPI: Patricia Wise is a 38 y.o. 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  Symptoms have progressively worsened over the last week.  Patient has been taking 800 mg ibuprofen intermittently twice daily for a toothache over the last week and a half.  Associated symptoms include one episode of emesis yesterday.  Emesis was noted to be nonbloody in appearance.  Tried taking omeprazole without relief of symptoms.  Denies having any chest pain, shortness of breath, fever, chills, dysuria, hematuria, diarrhea, or blood in stools.  Patient has previously been admitted back in 08/2017 and then in 11/2017 with similar symptoms.  After her last admission in 11/2017 she moved back to [place] and was supposed to follow-up with GI there for need of repeat EGD, but reports that she did not.   ED Course: Upon admission into the emergency department patient vital signs were stable.  Labs significant for WBC 10.9, BUN 21, creatinine 0.93.  COVID-19 screening pending.  Revealed gastric bypass with chronic inflammatory changes at the gastroduodenal asked anastomosis where there may be chronic marginal ulcer without signs of perforation.  She was given 1 L normal saline IV fluids, Zofran, morphine, GI cocktail, and protonix IV.  Dr. Bryan Lemma of [facility] was formally consulted requested patient remain n.p.o. for need of EGD later today, and to start Carafate 1 g 4 times daily.  Patient may need surgical intervention  for the chronic ulcer [facility] called to admit.  Review of symptoms: A complete 10 point review of systems was performed and negative except for as noted above in HPI  Past Medical History:  Diagnosis Date   BV (bacterial vaginosis) 07/2015   also candidial vaginosis 03/2017   Fatty liver 05/2017   noted along with gallstone in neck of GB on CT 08/15/17, LFTs normal at that time.    Morbid obesity ([place]) 2014   s/p gastric bypass in [place].     mrsa    Normocytic anemia 02/2017   Preterm labor    07/2007   Vaginal Pap smear, abnormal     Past Surgical History:  Procedure Laterality Date   BREAST SURGERY     augmentation   CESAREAN SECTION     CESAREAN SECTION N/A 08/14/2015    REPEAT CESAREAN SECTION;  Surgeon: Donnamae Jude, MD;    DILATION AND CURETTAGE OF UTERUS     DILATION AND EVACUATION N/A 02/18/2017   for missed Ab.    ESOPHAGOGASTRODUODENOSCOPY (EGD) WITH PROPOFOL N/A 08/16/2017   Procedure: ESOPHAGOGASTRODUODENOSCOPY (EGD) WITH PROPOFOL;  Surgeon: Irene Shipper, MD;  Location: [facility];  Service: Endoscopy;  Laterality: N/A;   GASTRIC BYPASS  2013 vs 2014   in [place] Bilateral    carpal tunnel     reports that she quit smoking about 6 years ago. She has never used smokeless tobacco. She reports current alcohol use. She reports that she does not use drugs.  No Known Allergies  Family History  Problem Relation Age of  Onset   Cancer Mother     Prior to Admission medications   Medication Sig Start Date End Date Taking? Authorizing Provider  metroNIDAZOLE (FLAGYL) 500 MG tablet Take 1 tablet (500 mg total) by mouth 2 (two) times daily. 06/29/19   Sloan Leiter, MD    Physical Exam:  Constitutional: Middle-aged female NAD, calm, comfortable Vitals:   07/12/19 0428 07/12/19 0452 07/12/19 0514 07/12/19 0600  BP: 108/74 100/73 113/78 109/77  Pulse: 65 61 63 60  Resp: 14 16 16 16   Temp: 98.4 F (36.9 C)  97.8 F (36.6 C)    TempSrc: Oral Oral    SpO2: 100% 100% 100% 99%   Eyes: PERRL, lids and conjunctivae normal ENMT: Mucous membranes are moist. Posterior pharynx clear of any exudate or lesions.  Neck: normal, supple, no masses, no thyromegaly Respiratory: clear to auscultation bilaterally, no wheezing, no crackles. Normal respiratory effort. No accessory muscle use.  Cardiovascular: Regular rate and rhythm, no murmurs / rubs / gallops. No extremity edema. 2+ pedal pulses. No carotid bruits.  Abdomen: Epigastric tenderness, no masses palpated. No hepatosplenomegaly. Bowel sounds positive.  Musculoskeletal: no clubbing / cyanosis. No joint deformity upper and lower extremities. Good ROM, no contractures. Normal muscle tone.  Skin: no rashes, lesions, ulcers. No induration Neurologic: CN 2-12 grossly intact. Sensation intact, DTR normal. Strength 5/5 in all 4.  Psychiatric: Normal judgment and insight. Alert and oriented x 3. Normal mood.     Labs on Admission: I have personally reviewed following labs and imaging studies  CBC: Recent Labs  Lab 07/11/19 2319  WBC 10.9*  HGB 13.5  HCT 43.3  MCV 94.7  PLT [id number]   Basic Metabolic Panel: Recent Labs  Lab 07/11/19 2319  NA 144  K 4.2  CL 109  CO2 27  GLUCOSE 90  BUN 21*  CREATININE 0.93  CALCIUM 9.1   GFR: CrCl cannot be calculated (Unknown ideal weight.). Liver Function Tests: Recent Labs  Lab 07/11/19 2319  AST 26  ALT 18  ALKPHOS 38  BILITOT 0.3  PROT 5.8*  ALBUMIN 3.6   Recent Labs  Lab 07/11/19 2319  LIPASE 36   No results for input(s): AMMONIA in the last 168 hours. Coagulation Profile: No results for input(s): INR, PROTIME in the last 168 hours. Cardiac Enzymes: No results for input(s): CKTOTAL, CKMB, CKMBINDEX, TROPONINI in the last 168 hours. BNP (last 3 results) No results for input(s): PROBNP in the last 8760 hours. HbA1C: No results for input(s): HGBA1C in the last 72 hours. CBG: No results  for input(s): GLUCAP in the last 168 hours. Lipid Profile: No results for input(s): CHOL, HDL, LDLCALC, TRIG, CHOLHDL, LDLDIRECT in the last 72 hours. Thyroid Function Tests: No results for input(s): TSH, T4TOTAL, FREET4, T3FREE, THYROIDAB in the last 72 hours. Anemia Panel: No results for input(s): VITAMINB12, FOLATE, FERRITIN, TIBC, IRON, RETICCTPCT in the last 72 hours. Urine analysis:    Component Value Date/Time   COLORURINE YELLOW 07/11/2019 2317   APPEARANCEUR HAZY (A) 07/11/2019 2317   LABSPEC 1.041 (H) 07/11/2019 2317   PHURINE 5.0 07/11/2019 2317   GLUCOSEU NEGATIVE 07/11/2019 2317   HGBUR NEGATIVE 07/11/2019 2317   BILIRUBINUR NEGATIVE 07/11/2019 2317   KETONESUR NEGATIVE 07/11/2019 2317   PROTEINUR NEGATIVE 07/11/2019 2317   UROBILINOGEN 0.2 02/15/2017 1013   NITRITE NEGATIVE 07/11/2019 2317   LEUKOCYTESUR NEGATIVE 07/11/2019 2317   Sepsis Labs: No results found for this or any previous visit (from the past 240 hour(s)).  Radiological Exams on Admission: Ct Abdomen Pelvis W Contrast  Result Date: 07/12/2019 CLINICAL DATA:  Acute generalized abdominal pain EXAM: CT ABDOMEN AND PELVIS WITH CONTRAST TECHNIQUE: Multidetector CT imaging of the abdomen and pelvis was performed using the standard protocol following bolus administration of intravenous contrast. CONTRAST:  168mL OMNIPAQUE IOHEXOL 300 MG/ML  SOLN COMPARISON:  11/21/2017 FINDINGS: Lower chest: Partially covered breast implants. No pathologic finding. Hepatobiliary: No focal liver abnormality.Lamellated stone in the gallbladder. No evidence of acute cholecystitis. Pancreas: Unremarkable. Spleen: Unremarkable. Adrenals/Urinary Tract: Negative adrenals. No hydronephrosis or stone. Unremarkable bladder. Stomach/Bowel: Gastric bypass with mild wall thickening and redemonstrated jejunal outpouching at the gastrojejunal anastomosis. Fat inflammation is stable or mildly improved from prior. No bowel obstruction. No  appendicitis. Vascular/Lymphatic: No acute vascular abnormality. No mass or adenopathy. Reproductive:No pathologic findings.  Corpus luteum on both sides. Other: Small volume pelvic fluid, usually considered physiologic in this setting. Musculoskeletal: No acute abnormalities. IMPRESSION: 1. Gastric bypass with chronic inflammatory changes at the gastrojejunal anastomosis where there may be a chronic marginal ulcer. No perforation. 2. Free pelvic fluid, usually physiologic. 3. Cholelithiasis without evidence of acute cholecystitis. Electronically Signed   By: Monte Fantasia M.D.   On: 07/12/2019 05:48    EKG: Independently reviewed.  Normal sinus rhythm at 80 bpm Assessment/Plan Intractable abdominal pain, history of Roux-en-Y gastric bypass with marginal ulcers: Recurrent.  Patient presents with severe epigastric abdominal pain.  Reports 1-1/2 weeks history of NSAID use.  Records show at least 2 previous admissions in the last 2 years for similar.   -Admit to a MedSurg bed -N.p.o. -Antiemetics as needed -IV morphine as needed for pain -Protonix IV twice daily -Carafate 1 g 4 times daily -Appreciate GI consultative services, will follow-up for further recommendation  Leukocytosis: Acute.  WBC see elevated at 10.9.  Suspect reactive to above. -Recheck CBC in a.m.  COVID-19 screening negative   DVT prophylaxis: Lovenox Code Status: Full Family Communication: No family requested to be updated Disposition Plan: Likely discharge home in 1 to 2 days Consults called: GI Admission status: Observation  Norval Morton MD [facility] Pager [phone]   If 7PM-7AM, please contact night-coverage [email or web address] Password [email or web address]  07/12/2019, 7:23 AM

## 2019-07-12 NOTE — Op Note (Addendum)
[facility] Patient Name: Patricia Wise Procedure Date : 07/12/2019 MRN: [id number] Attending MD: Jackquline Denmark , MD Date of Birth: 12/01/1980 CSN: [id number] Age: 38 Admit Type: Inpatient Procedure:                Upper GI endoscopy Indications:              Epigastric abdominal pain Providers:                Jackquline Denmark, MD, Josie Dixon, RN, Carlyn Reichert, RN, Cherylynn Ridges, Technician, Luciana Axe, CRNA Referring MD:              Medicines:                Monitored Anesthesia Care Complications:            No immediate complications. Estimated Blood Loss:     Estimated blood loss: none. Procedure:                Pre-Anesthesia Assessment:                           - Prior to the procedure, a History and Physical                            was performed, and patient medications and                            allergies were reviewed. The patient's tolerance of                            previous anesthesia was also reviewed. The risks                            and benefits of the procedure and the sedation                            options and risks were discussed with the patient.                            All questions were answered, and informed consent                            was obtained. Prior Anticoagulants: The patient has                            taken no previous anticoagulant or antiplatelet                            agents. ASA Grade Assessment: II - A patient with  mild systemic disease. After reviewing the risks                            and benefits, the patient was deemed in                            satisfactory condition to undergo the procedure.                           After obtaining informed consent, the endoscope was                            passed under direct vision. Throughout the                            procedure, the patient's blood  pressure, pulse, and                            oxygen saturations were monitored continuously. The                            [id number]) Olympus gastroscope was                            introduced through the mouth, and advanced to the                            jejunum. The upper GI endoscopy was accomplished                            without difficulty. The patient tolerated the                            procedure well. Scope In: Scope Out: Findings:      The examined esophagus was normal.      Evidence of a Roux-en-Y gastrojejunostomy was found with gastric remnant       measuring 5 cm. Patent GJ anastomosis with approximate diameter of 12       mm. Mild gastritis in the gastric remnant. Biopsies were taken with a       cold forceps for histology. JJ anastomosis not identified.      Two large ulcers, (measuring 1.5 cm and 1 cm each) with clean whitish       base and erythematous margins was visualized just distal to the       anastomosis. Multiple biopsies were obtained from the edges and sent for       histology. Impression:               -Anastomotic ulcers.                           -S/P Roux-en-Y gastric bypass. Recommendation:           - I believe she can be discharged home followed as  an outpatient.                           - Full liquid diet. Advance as tolerated.                           - Omeprazole capsule 40mg  po BID (can open the                            capsule and put it in applesauce twice a day).                            Needs to be more compliant.                           - Carafate elixir 1 g p.o. 4 times daily x 2 weeks.                           - She continues to use significant ibuprofen. Stop                            all nonsteroidals.                           - Follow biopsies.                           - Return to GI clinic in 8 weeks. Recommend                            repeating EGD in 12 weeks to ensure  healing of the                            ulcers. Procedure Code(s):        --- Professional ---                           [phone], Esophagogastroduodenoscopy, flexible,                            transoral; with biopsy, single or multiple Diagnosis Code(s):        --- Professional ---                           Z98.0, Intestinal bypass and anastomosis status                           K26.9, Duodenal ulcer, unspecified as acute or                            chronic, without hemorrhage or perforation                           R10.13, Epigastric pain CPT copyright 2019 [employer]. All rights reserved. The codes documented in this report are preliminary and upon  coder review may  be revised to meet current compliance requirements. Jackquline Denmark, MD 07/12/2019 11:17:23 AM This report has been signed electronically. Number of Addenda: 0

## 2019-07-12 NOTE — Consult Note (Addendum)
[facility] Consult: 8:18 AM 07/12/2019  LOS: 0 days    Referring Provider: Dr Fuller Plan  Primary Care Physician:  Patient, No Pcp Per Vivien Rota, MD is her gynecologist. Primary Gastroenterologist:  unassigned     Reason for Consultation:  Abdominal pain, N/V/D   HPI: Patricia Wise is a 38 y.o. female.  Hx obesity.  S/p R n Y Gastric bypass ~ 2013 in [place].  Fatty liver, hepatomegaly.  Marginal/anastomotic ulcers with associated GI blood loss and iron deficiency anemia. 08/2017 EGD, Dr Scarlette Shorts:  Large, 10 cm, ulcer just distal to GJ anastomosis.  No active or stigmata of bleeding.  Recommended Protonix 40 mg packet bid emptied into applesauce. 11/2017 GI evaluation by Dr. Arta Silence for epigastric pain similar to presentation in 08/2017.  She had taken Protonix as prescribed for only 2 weeks in November.  She was advised to resume the Protonix bid for 6 weeks and then daily indefinitely.  Serum H. pylori IgG negative suggested EGD in 2 to 3 months but did not see clear indication for immediate EGD.  Never fup w Dr Paulita Fujita or with a GI physician in [place] where she had relocated..     For > 2 weeks been taking 1.6 g ibuprofen, 1.9 g of acetaminophen daily to address tooth ache.  She had completed a course of amoxicillin 3 or 4 weeks ago and is due to have the right upper molar pulled low next week.  Abdominal pain began 10 days ago ago at which point she started taking omeprazole 20 mg/day.  Pain is in the epigastrium, worse after eating, same pattern as on previous presentations in 2018 and 2019.  No nausea or vomiting.  Pain got excruciating yesterday and began radiating into her right shoulder and back.  She presented to the ED.  Hgb 13.5.  BUN marginally elevated @ 21 but o/w unremarkable CMET.   Covid 19 negative CTAP w contrast: Inflammatory changes at the [place] anastomosis suggesting chronic marginal ulcer.  No perforation.  Free, likely physiologic, pelvic fluid.  Cholelithiasis but no evidence for cholecystitis. Started on Protonix 40 IV bid.    Social Hx: Single mother.  52-year-old and 14 year old children.  Works in the [employer].  Rare if any alcohol.  No illicit drugs.    Past Medical History:  Diagnosis Date   BV (bacterial vaginosis) 07/2015   also candidial vaginosis 03/2017   Fatty liver 05/2017   noted along with gallstone in neck of GB on CT 08/15/17, LFTs normal at that time.    Morbid obesity ([place]) 2014   s/p gastric bypass in [place].     mrsa    Normocytic anemia 02/2017   Preterm labor    07/2007   Vaginal Pap smear, abnormal     Past Surgical History:  Procedure Laterality Date   BREAST SURGERY     augmentation   CESAREAN SECTION     CESAREAN SECTION N/A 08/14/2015    REPEAT CESAREAN SECTION;  Surgeon: Lavella Lemons  Virginia Crews, MD;    DILATION AND CURETTAGE OF UTERUS     DILATION AND EVACUATION N/A 02/18/2017   for missed Ab.    ESOPHAGOGASTRODUODENOSCOPY (EGD) WITH PROPOFOL N/A 08/16/2017   Procedure: ESOPHAGOGASTRODUODENOSCOPY (EGD) WITH PROPOFOL;  Surgeon: Irene Shipper, MD;  Location: [facility];  Service: Endoscopy;  Laterality: N/A;   GASTRIC BYPASS  2013 vs 2014   in [place] Bilateral    carpal tunnel    Prior to Admission medications   Medication Sig Start Date End Date Taking? Authorizing Provider  metroNIDAZOLE (FLAGYL) 500 MG tablet Take 1 tablet (500 mg total) by mouth 2 (two) times daily. 06/29/19   Sloan Leiter, MD    Scheduled Meds:  enoxaparin (LOVENOX) injection  40 mg Subcutaneous Daily   pantoprazole (PROTONIX) IV  40 mg Intravenous Q12H   sodium chloride flush  3 mL Intravenous Q12H   sucralfate  1 g Oral QID   Infusions:   sodium chloride 125 mL/hr at 07/12/19 0641   PRN Meds: acetaminophen **OR** acetaminophen, albuterol, morphine injection, ondansetron **OR** ondansetron (ZOFRAN) IV   Allergies as of 07/11/2019   (No Known Allergies)    Family History  Problem Relation Age of Onset   Cancer Mother     Social History   Socioeconomic History   Marital status: Single    Spouse name: Not on file   Number of children: Not on file   Years of education: Not on file   Highest education level: Not on file  Occupational History   Not on file  Social Needs   Financial resource strain: Not on file   Food insecurity    Worry: Not on file    Inability: Not on file   Transportation needs    Medical: Not on file    Non-medical: Not on file  Tobacco Use   Smoking status: Former Smoker    Quit date: 08/04/2012    Years since quitting: 6.9   Smokeless tobacco: Never Used  Substance and Sexual Activity   Alcohol use: Yes    Comment: OCCASIONAL   Drug use: No   Sexual activity: Yes    Birth control/protection: None  Lifestyle   Physical activity    Days per week: Not on file    Minutes per session: Not on file   Stress: Not on file  Relationships   Social connections    Talks on phone: Not on file    Gets together: Not on file    Attends religious service: Not on file    Active member of club or organization: Not on file    Attends meetings of clubs or organizations: Not on file    Relationship status: Not on file   Intimate partner violence    Fear of current or ex partner: Not on file    Emotionally abused: Not on file    Physically abused: Not on file    Forced sexual activity: Not on file  Other Topics Concern   Not on file  Social History Narrative   Not on file    REVIEW OF SYSTEMS: Constitutional: No weakness, no fatigue. ENT:  No nose bleeds Pulm: No difficulty breathing or cough. CV:  No palpitations, no LE edema.  No angina. GU:  No hematuria, no  frequency.  Urine straw-colored. GYN:   06/22/2019 pelvic exam/Pap smear: Candida and bacterial vaginitis noted on wet prep.Marland Kitchen  Heavier, more painful periods  reported at GYN visit 06/22/2019.  Normal pelvic ultrasound 9/28 GI: Up until events of the last couple of weeks, she been doing well without GI issues. Heme: Denies excessive or unusual bleeding or bruising. Transfusions: None Neuro:  No headaches, no peripheral tingling or numbness Derm:  No itching, no rash or sores.  Endocrine:  No sweats or chills.  No polyuria or dysuria Immunization: None Travel:  None beyond local counties in last few months.    PHYSICAL EXAM: Vital signs in last 24 hours: Vitals:   07/12/19 0514 07/12/19 0600  BP: 113/78 109/77  Pulse: 63 60  Resp: 16 16  Temp:    SpO2: 100% 99%   Wt Readings from Last 3 Encounters:  06/22/19 104.5 kg  11/21/17 96.2 kg  08/15/17 95.3 kg    General: Well-appearing, pleasant, calm, comfortable Head: No facial asymmetry or swelling.  No signs of head trauma. Eyes: No conjunctival pallor.  No scleral icterus.  EOMI. Ears: Not hard of hearing Nose: Congestion or discharge Mouth: Oropharynx moist, clear, pink. Neck: No JVD, no masses, no thyromegaly Lungs: Clear bilaterally.  No labored breathing or cough Heart: RRR. Abdomen: Soft.  Not distended.  Active bowel sounds.  Tender in the upper abdomen, nonfocal.  No guarding or rebound..   Rectal: Deferred Musc/Skeltl: No joint redness, swelling or gross deformity. Extremities: No CCE Neurologic: Alert.  Oriented x3.  No limb weakness or tremors. Skin: No rash, no sores, no suspicious lesions. Tattoos: Multiple high-quality artistic tattoos on the trunk and arms Nodes: No cervical adenopathy Psych: Calm, pleasant, cooperative.  Intake/Output from previous day: No intake/output data recorded. Intake/Output this shift: No intake/output data recorded.  LAB RESULTS: Recent Labs    07/11/19 2319  WBC 10.9*  HGB  13.5  HCT 43.3  PLT 342   BMET Lab Results  Component Value Date   NA 144 07/11/2019   NA 142 11/21/2017   NA 141 11/20/2017   K 4.2 07/11/2019   K 4.0 11/21/2017   K 4.4 11/20/2017   CL 109 07/11/2019   CL 109 11/21/2017   CL 106 11/20/2017   CO2 27 07/11/2019   CO2 26 11/21/2017   CO2 28 11/20/2017   GLUCOSE 90 07/11/2019   GLUCOSE 97 11/21/2017   GLUCOSE 108 (H) 11/20/2017   BUN 21 (H) 07/11/2019   BUN 11 11/21/2017   BUN 11 11/20/2017   CREATININE 0.93 07/11/2019   CREATININE 0.85 11/21/2017   CREATININE 0.78 11/20/2017   CALCIUM 9.1 07/11/2019   CALCIUM 8.4 (L) 11/21/2017   CALCIUM 8.9 11/20/2017   LFT Recent Labs    07/11/19 2319  PROT 5.8*  ALBUMIN 3.6  AST 26  ALT 18  ALKPHOS 38  BILITOT 0.3   Lipase     Component Value Date/Time   LIPASE 36 07/11/2019 2319    Drugs of Abuse  No results found for: LABOPIA, COCAINSCRNUR, LABBENZ, AMPHETMU, THCU, LABBARB   RADIOLOGY STUDIES: Ct Abdomen Pelvis W Contrast  Result Date: 07/12/2019 CLINICAL DATA:  Acute generalized abdominal pain EXAM: CT ABDOMEN AND PELVIS WITH CONTRAST TECHNIQUE: Multidetector CT imaging of the abdomen and pelvis was performed using the standard protocol following bolus administration of intravenous contrast. CONTRAST:  158mL OMNIPAQUE IOHEXOL 300 MG/ML  SOLN COMPARISON:  11/21/2017 FINDINGS: Lower chest: Partially covered breast implants. No pathologic finding. Hepatobiliary: No focal liver abnormality.Lamellated stone in the gallbladder. No evidence of acute cholecystitis. Pancreas: Unremarkable. Spleen: Unremarkable. Adrenals/Urinary Tract: Negative adrenals. No hydronephrosis or stone. Unremarkable  bladder. Stomach/Bowel: Gastric bypass with mild wall thickening and redemonstrated jejunal outpouching at the gastrojejunal anastomosis. Fat inflammation is stable or mildly improved from prior. No bowel obstruction. No appendicitis. Vascular/Lymphatic: No acute vascular abnormality. No mass  or adenopathy. Reproductive:No pathologic findings.  Corpus luteum on both sides. Other: Small volume pelvic fluid, usually considered physiologic in this setting. Musculoskeletal: No acute abnormalities. IMPRESSION: 1. Gastric bypass with chronic inflammatory changes at the gastrojejunal anastomosis where there may be a chronic marginal ulcer. No perforation. 2. Free pelvic fluid, usually physiologic. 3. Cholelithiasis without evidence of acute cholecystitis. Electronically Signed   By: Monte Fantasia M.D.   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  Large daily doses ibuprofen in the last couple of weeks likely contributing.    PLAN:     *    EGD today.     Azucena Freed  07/12/2019, 8:18 AM Phone [phone]     Attending physician's note   I have taken an interval history, reviewed the chart and examined the patient. I agree with the Advanced Practitioner's note, impression and recommendations.   Epigastric pain with nausea/vomiting.  History of marginal ulcer at [place] anastomosis. S/P Roux-en-Y gastric bypass 2013 Significant nonsteroidal use. Noncompliant with PPIs No GI bleeding.  Plan: -IV Protonix -EGD today.  Explained risks and benefits. -Stop nonsteroidals. -Discussed need for compliance.  Carmell Austria, MD [facility] [phone].

## 2019-07-12 NOTE — Anesthesia Procedure Notes (Signed)
Procedure Name: MAC Date/Time: 07/12/2019 10:50 AM Performed by: Jenne Campus, CRNA Pre-anesthesia Checklist: Patient identified, Emergency Drugs available, Suction available and Patient being monitored Oxygen Delivery Method: Nasal cannula

## 2019-07-12 NOTE — ED Notes (Signed)
Returned from CT.

## 2019-07-12 NOTE — ED Notes (Signed)
Patient transported to CT 

## 2019-07-13 ENCOUNTER — Other Ambulatory Visit: Payer: Self-pay | Admitting: Physician Assistant

## 2019-07-13 DIAGNOSIS — R109 Unspecified abdominal pain: Secondary | ICD-10-CM | POA: Diagnosis not present

## 2019-07-13 LAB — CBC
HCT: 36 % (ref 36.0–46.0)
Hemoglobin: 11.2 g/dL — ABNORMAL LOW (ref 12.0–15.0)
MCH: 29.6 pg (ref 26.0–34.0)
MCHC: 31.1 g/dL (ref 30.0–36.0)
MCV: 95 fL (ref 80.0–100.0)
Platelets: 247 10*3/uL (ref 150–400)
RBC: 3.79 MIL/uL — ABNORMAL LOW (ref 3.87–5.11)
RDW: 13.9 % (ref 11.5–15.5)
WBC: 5.9 10*3/uL (ref 4.0–10.5)
nRBC: 0 % (ref 0.0–0.2)

## 2019-07-13 LAB — BASIC METABOLIC PANEL
Anion gap: 8 (ref 5–15)
BUN: 12 mg/dL (ref 6–20)
CO2: 24 mmol/L (ref 22–32)
Calcium: 8.4 mg/dL — ABNORMAL LOW (ref 8.9–10.3)
Chloride: 109 mmol/L (ref 98–111)
Creatinine, Ser: 1.15 mg/dL — ABNORMAL HIGH (ref 0.44–1.00)
GFR calc Af Amer: >60 mL/min (ref >60)
GFR calc non Af Amer: >60 mL/min (ref >60)
Glucose, Bld: 91 mg/dL (ref 70–99)
Potassium: 4.3 mmol/L (ref 3.5–5.1)
Sodium: 141 mmol/L (ref 135–145)

## 2019-07-13 LAB — SURGICAL PATHOLOGY

## 2019-07-13 MED ORDER — PANTOPRAZOLE SODIUM 40 MG PO TBEC: 40.0000 mg | DELAYED_RELEASE_TABLET | Freq: Every day | ORAL | 11 refills | Status: DC

## 2019-07-13 MED ORDER — PANTOPRAZOLE SODIUM 40 MG PO TBEC: 40.0000 mg | DELAYED_RELEASE_TABLET | Freq: Two times a day (BID) | ORAL | 11 refills | Status: DC

## 2019-07-13 MED ORDER — SUCRALFATE 1 GM/10ML PO SUSP: 1.0000 g | Freq: Four times a day (QID) | ORAL | 0 refills | Status: DC

## 2019-07-13 MED FILL — SUCRALFATE 1 GM/10ML SUSP: 1 | 10 days supply | Qty: 420 | Fill #0

## 2019-07-13 MED FILL — PANTOPRAZOLE SOD DR 40 MG T: 40 | 30 days supply | Qty: 60 | Fill #0

## 2019-07-13 NOTE — Anesthesia Postprocedure Evaluation (Signed)
Anesthesia Post Note  Patient: Patricia Wise  Procedure(s) Performed: ESOPHAGOGASTRODUODENOSCOPY (EGD) WITH PROPOFOL (N/A ) BIOPSY     Patient location during evaluation: PACU Anesthesia Type: MAC Level of consciousness: awake and alert Pain management: pain level controlled Vital Signs Assessment: post-procedure vital signs reviewed and stable Respiratory status: spontaneous breathing, nonlabored ventilation, respiratory function stable and patient connected to nasal cannula oxygen Cardiovascular status: stable and blood pressure returned to baseline Postop Assessment: no apparent nausea or vomiting Anesthetic complications: no    Last Vitals:  Vitals:   07/12/19 1823 07/12/19 2026  BP: 99/62 (!) 91/59  Pulse: 64 (!) 53  Resp: 18 16  Temp: 36.8 C 36.9 C  SpO2: 98% 98%    Last Pain:  Vitals:   07/13/19 0750  TempSrc:   PainSc: 0-No pain                 Tiajuana Amass

## 2019-07-13 NOTE — Discharge Summary (Signed)
Physician Discharge Summary  Patricia Wise [id number] DOB: [birth year] DOA: 07/11/2019  PCP: Patient, No Pcp Per  Admit date: 07/11/2019 Discharge date: 07/13/2019  Time spent: 45 minutes  Recommendations for Outpatient Follow-up:  1. Follow up with GI as instructed    Discharge Diagnoses:  Principal Problem:   Intractable abdominal pain Active Problems:   H/O gastric bypass   PUD (peptic ulcer disease)   Leukocytosis   Discharge Condition: stable  Diet recommendation: soft advance as tolerated  Filed Weights   07/12/19 1006  Weight: 102.1 kg    History of present illness:  Patricia Wise is a 38 y.o. female with medical history significant of morbid obesity s/p Roux-en-Y gastric bypass in 2014, marginal ulcers distal to gastrojejunal anastomosis, cholelithiasis, and normocytic anemia; who presented 10/8 with complaints of severe upper abdominal pain.  Symptoms  progressively worsened over a week.  Patient had been taking 800 mg ibuprofen intermittently twice daily for a toothache over the  week and a half.  Associated symptoms include one episode of emesis.  Emesis was noted to be nonbloody in appearance.  Tried taking omeprazole without relief of symptoms.  Denied having any chest pain, shortness of breath, fever, chills, dysuria, hematuria, diarrhea, or blood in stools.  Patient previously been admitted back in 08/2017 and then in 11/2017 with similar symptoms.  After her last admission in 11/2017 she moved back to [place] and was supposed to follow-up with GI there for need of repeat EGD, but reported that she did not.  Hospital Course:   Intractable abdominal pain, history of Roux-en-Y gastric bypass with marginal ulcers: Recurrent.  Patient presented with severe epigastric abdominal pain.  Reported 1-1/2 weeks history of NSAID use.  Records show at least 2 previous admissions in the last 2 years for similar. Evaluated by GI and EGD revealing Anastomotic ulcers,S/P  Roux-en-Y gastric bypass. Recommended discharge home followed as an outpatient. Full liquid diet. Advance as tolerated. Omeprazole capsule 40mg  po BID (can open the capsule and put it in applesauce  Leukocytosis: Acute and likely reactive. Resolved at discharge. No s/sx infection.   COVID-19 COVID-19 screening negative  Procedures:  EGD 07/13/19  Consultations:  [facility]  Discharge Exam: Vitals:   07/12/19 1823 07/12/19 2026  BP: 99/62 (!) 91/59  Pulse: 64 (!) 53  Resp: 18 16  Temp: 98.2 F (36.8 C) 98.4 F (36.9 C)  SpO2: 98% 98%    General: awake alert sitting up in bed eating breakfast Cardiovascular: rrr no mgr no LE edeam Respiratory: normal effort BS clear bilaterally  Discharge Instructions   Discharge Instructions    Diet - low sodium heart healthy   Complete by: As directed    Discharge instructions   Complete by: As directed    Take medications as prescribed Follow up with GI MD as instructed   Increase activity slowly   Complete by: As directed      Allergies as of 07/13/2019   No Known Allergies     Medication List    TAKE these medications   pantoprazole 40 MG tablet Commonly known as: Protonix Take 1 tablet (40 mg total) by mouth 2 (two) times daily.   sucralfate 1 GM/10ML suspension Commonly known as: CARAFATE Take 10 mLs (1 g total) by mouth 4 (four) times daily.      No Known Allergies    The results of significant diagnostics from this hospitalization (including imaging, microbiology, ancillary and laboratory) are listed below for reference.  Significant Diagnostic Studies: Ct Abdomen Pelvis W Contrast  Result Date: 07/12/2019 CLINICAL DATA:  Acute generalized abdominal pain EXAM: CT ABDOMEN AND PELVIS WITH CONTRAST TECHNIQUE: Multidetector CT imaging of the abdomen and pelvis was performed using the standard protocol following bolus administration of intravenous contrast. CONTRAST:  119mL OMNIPAQUE IOHEXOL 300 MG/ML  SOLN  COMPARISON:  11/21/2017 FINDINGS: Lower chest: Partially covered breast implants. No pathologic finding. Hepatobiliary: No focal liver abnormality.Lamellated stone in the gallbladder. No evidence of acute cholecystitis. Pancreas: Unremarkable. Spleen: Unremarkable. Adrenals/Urinary Tract: Negative adrenals. No hydronephrosis or stone. Unremarkable bladder. Stomach/Bowel: Gastric bypass with mild wall thickening and redemonstrated jejunal outpouching at the gastrojejunal anastomosis. Fat inflammation is stable or mildly improved from prior. No bowel obstruction. No appendicitis. Vascular/Lymphatic: No acute vascular abnormality. No mass or adenopathy. Reproductive:No pathologic findings.  Corpus luteum on both sides. Other: Small volume pelvic fluid, usually considered physiologic in this setting. Musculoskeletal: No acute abnormalities. IMPRESSION: 1. Gastric bypass with chronic inflammatory changes at the gastrojejunal anastomosis where there may be a chronic marginal ulcer. No perforation. 2. Free pelvic fluid, usually physiologic. 3. Cholelithiasis without evidence of acute cholecystitis. Electronically Signed   By: Monte Fantasia M.D.   On: 07/12/2019 05:48   US Pelvic Complete With Transvaginal  Result Date: 07/02/2019 CLINICAL DATA:  Pelvic pain, menorrhagia EXAM: TRANSABDOMINAL AND TRANSVAGINAL ULTRASOUND OF PELVIS TECHNIQUE: Both transabdominal and transvaginal ultrasound examinations of the pelvis were performed. Transabdominal technique was performed for global imaging of the pelvis including uterus, ovaries, adnexal regions, and pelvic cul-de-sac. It was necessary to proceed with endovaginal exam following the transabdominal exam to visualize the uterus, endometrium, ovaries and adnexa. COMPARISON:  CT 11/21/2017 FINDINGS: Uterus Measurements: 10.5 x 4.5 x 5.3 cm = volume: 131 mL. No fibroids or other mass visualized. Endometrium Thickness: 9 mm.  No focal abnormality visualized. Right ovary  Measurements: 2.6 x 1.5 x 1.7 cm = volume: 3.6 mL. Normal appearance/no adnexal mass. Left ovary Measurements: 2.8 x 2.2 x 2.2 cm = volume: 6.8 mL. Normal appearance/no adnexal mass. Other findings No abnormal free fluid. IMPRESSION: Normal pelvic ultrasound. Electronically Signed   By: Rolm Baptise M.D.   On: 07/02/2019 09:00    Microbiology: Recent Results (from the past 240 hour(s))  SARS Coronavirus 2 [facility] order, Performed in [facility] hospital lab) Nasopharyngeal Nasopharyngeal Swab     Status: None   Collection Time: 07/12/19  6:42 AM   Specimen: Nasopharyngeal Swab  Result Value Ref Range Status   SARS Coronavirus 2 NEGATIVE NEGATIVE Final    Comment: (NOTE) If result is NEGATIVE SARS-CoV-2 target nucleic acids are NOT DETECTED. The SARS-CoV-2 RNA is generally detectable in upper and lower  respiratory specimens during the acute phase of infection. The lowest  concentration of SARS-CoV-2 viral copies this assay can detect is 250  copies / mL. A negative result does not preclude SARS-CoV-2 infection  and should not be used as the sole basis for treatment or other  patient management decisions.  A negative result may occur with  improper specimen collection / handling, submission of specimen other  than nasopharyngeal swab, presence of viral mutation(s) within the  areas targeted by this assay, and inadequate number of viral copies  (<250 copies / mL). A negative result must be combined with clinical  observations, patient history, and epidemiological information. If result is POSITIVE SARS-CoV-2 target nucleic acids are DETECTED. The SARS-CoV-2 RNA is generally detectable in upper and lower  respiratory specimens dur ing the acute phase of  infection.  Positive  results are indicative of active infection with SARS-CoV-2.  Clinical  correlation with patient history and other diagnostic information is  necessary to determine patient infection status.  Positive results do   not rule out bacterial infection or co-infection with other viruses. If result is PRESUMPTIVE POSTIVE SARS-CoV-2 nucleic acids MAY BE PRESENT.   A presumptive positive result was obtained on the submitted specimen  and confirmed on repeat testing.  While 2019 novel coronavirus  (SARS-CoV-2) nucleic acids may be present in the submitted sample  additional confirmatory testing may be necessary for epidemiological  and / or clinical management purposes  to differentiate between  SARS-CoV-2 and other Sarbecovirus currently known to infect humans.  If clinically indicated additional testing with an alternate test  methodology [phone]) is advised. The SARS-CoV-2 RNA is generally  detectable in upper and lower respiratory sp ecimens during the acute  phase of infection. The expected result is Negative. Fact Sheet for Patients:  StrictlyIdeas.no Fact Sheet for Healthcare Providers: BankingDealers.co.za This test is not yet approved or cleared by the [place] FDA and has been authorized for detection and/or diagnosis of SARS-CoV-2 by FDA under an Emergency Use Authorization (EUA).  This EUA will remain in effect (meaning this test can be used) for the duration of the COVID-19 declaration under Section 564(b)(1) of the Act, 21 U.S.C. section 360bbb-3(b)(1), unless the authorization is terminated or revoked sooner. Performed at [facility], [place] [address], [place], [place] [address]      Labs: Basic Metabolic Panel: Recent Labs  Lab 07/11/19 2319 07/13/19 0532  NA 144 141  K 4.2 4.3  CL 109 109  CO2 27 24  GLUCOSE 90 91  BUN 21* 12  CREATININE 0.93 1.15*  CALCIUM 9.1 8.4*   Liver Function Tests: Recent Labs  Lab 07/11/19 2319  AST 26  ALT 18  ALKPHOS 38  BILITOT 0.3  PROT 5.8*  ALBUMIN 3.6   Recent Labs  Lab 07/11/19 2319  LIPASE 36   No results for input(s): AMMONIA in the last 168 hours. CBC: Recent  Labs  Lab 07/11/19 2319 07/13/19 0532  WBC 10.9* 5.9  HGB 13.5 11.2*  HCT 43.3 36.0  MCV 94.7 95.0  PLT 342 247   Cardiac Enzymes: No results for input(s): CKTOTAL, CKMB, CKMBINDEX, TROPONINI in the last 168 hours. BNP: BNP (last 3 results) No results for input(s): BNP in the last 8760 hours.  ProBNP (last 3 results) No results for input(s): PROBNP in the last 8760 hours.  CBG: No results for input(s): GLUCAP in the last 168 hours.     SignedRadene Gunning NP [facility] 07/13/2019, 10:57 AM

## 2019-07-13 NOTE — Discharge Instructions (Signed)
Omeprazole capsule 40mg  po BID (can open the  capsule and put it in applesauce twice a day).                            Needs to becompliant.                           - Carafate elixir 1 g p.o. 4 times daily x 2 weeks.                           - Stop  all nonsteroidals.                                                   - Return to GI clinic in 8 weeks. Recommend                            repeating EGD in 12 weeks to ensure healing of the                            ulcers.

## 2019-07-13 NOTE — Plan of Care (Signed)
  Problem: Education: Goal: Knowledge of General Education information will improve Description Including pain rating scale, medication(s)/side effects and non-pharmacologic comfort measures Outcome: Progressing   

## 2019-12-03 DIAGNOSIS — U071 COVID-19: Secondary | ICD-10-CM

## 2019-12-03 HISTORY — DX: COVID-19: U07.1

## 2020-07-30 ENCOUNTER — Encounter: Payer: Self-pay | Admitting: Obstetrics and Gynecology

## 2020-07-30 ENCOUNTER — Other Ambulatory Visit: Payer: Self-pay

## 2020-07-30 ENCOUNTER — Ambulatory Visit (INDEPENDENT_AMBULATORY_CARE_PROVIDER_SITE_OTHER): Payer: Medicaid Other | Admitting: Obstetrics and Gynecology

## 2020-07-30 ENCOUNTER — Other Ambulatory Visit (HOSPITAL_COMMUNITY)
Admission: RE | Admit: 2020-07-30 | Discharge: 2020-07-30 | Disposition: A | Discharge status: Home / Self Care | Payer: Medicaid Other | Source: Ambulatory Visit | Attending: Obstetrics and Gynecology | Admitting: Obstetrics and Gynecology

## 2020-07-30 VITALS — BP 137/94 | HR 96 | Ht 66.0 in | Wt 221.0 lb

## 2020-07-30 DIAGNOSIS — Z3009 Encounter for other general counseling and advice on contraception: Secondary | ICD-10-CM | POA: Diagnosis not present

## 2020-07-30 DIAGNOSIS — Z01419 Encounter for gynecological examination (general) (routine) without abnormal findings: Secondary | ICD-10-CM

## 2020-07-30 DIAGNOSIS — Z113 Encounter for screening for infections with a predominantly sexual mode of transmission: Secondary | ICD-10-CM

## 2020-07-30 DIAGNOSIS — Z7189 Other specified counseling: Secondary | ICD-10-CM

## 2020-07-30 DIAGNOSIS — Z124 Encounter for screening for malignant neoplasm of cervix: Secondary | ICD-10-CM | POA: Diagnosis not present

## 2020-07-30 NOTE — Patient Instructions (Addendum)
Use the following websites (and others) to help learn more about your contraception options and find the method that is right for you!  - The Centers for Disease Control (CDC) website: https://www.cdc.gov/reproductivehealth/contraception/index.htm  - [facility] website: https://www.plannedparenthood.org/learn/birth-control  - Bedsider.org: https://www.bedsider.org/methods   Go to bedsider.org for more information!  Contraception Choices Contraception, also called birth control, refers to methods or devices that prevent pregnancy. Hormonal methods Contraceptive implant A contraceptive implant is a thin, plastic tube that contains a hormone. It is inserted into the upper part of the arm. It can remain in place for up to 3 years. Progestin-only injections Progestin-only injections are injections of progestin, a synthetic form of the hormone progesterone. They are given every 3 months by a health care provider. Birth control pills Birth control pills are pills that contain hormones that prevent pregnancy. They must be taken once a day, preferably at the same time each day. Birth control patch The birth control patch contains hormones that prevent pregnancy. It is placed on the skin and must be changed once a week for three weeks and removed on the fourth week. A prescription is needed to use this method of contraception. Vaginal ring A vaginal ring contains hormones that prevent pregnancy. It is placed in the vagina for three weeks and removed on the fourth week. After that, the process is repeated with a new ring. A prescription is needed to use this method of contraception. Emergency contraceptive Emergency contraceptives prevent pregnancy after unprotected sex. They come in pill form and can be taken up to 5 days after sex. They work best the sooner they are taken after having sex. Most emergency contraceptives are available without a prescription. This method should not be used as  your only form of birth control. Barrier methods Female condom A female condom is a thin sheath that is worn over the penis during sex. Condoms keep sperm from going inside a woman's body. They can be used with a spermicide to increase their effectiveness. They should be disposed after a single use. Female condom A female condom is a soft, loose-fitting sheath that is put into the vagina before sex. The condom keeps sperm from going inside a woman's body. They should be disposed after a single use.  Intrauterine contraception Intrauterine device (IUD) An IUD is a T-shaped device that is put in a woman's uterus. There are two types:  Hormone IUD.This type contains progestin, a synthetic form of the hormone progesterone. This type can stay in place for 3-5 years.  Copper IUD.This type is wrapped in copper wire. It can stay in place for 10 years.  Permanent methods of contraception Female tubal ligation In this method, a woman's fallopian tubes are sealed, tied, or blocked during surgery to prevent eggs from traveling to the uterus.  Female sterilization This is a procedure to tie off the tubes that carry sperm (vasectomy). After the procedure, the man can still ejaculate fluid (semen).  Summary  Contraception, also called birth control, means methods or devices that prevent pregnancy.  Hormonal methods of contraception include implants, injections, pills, patches, vaginal rings, and emergency contraceptives.  Barrier methods of contraception can include female condoms, female condoms, diaphragms, cervical caps, sponges, and spermicides.  There are two types of IUDs (intrauterine devices). An IUD can be put in a woman's uterus to prevent pregnancy for 3-5 years.  Permanent sterilization can be done through a procedure for males, females, or both. This information is not intended to replace advice given to you   by your health care provider. Make sure you discuss any questions you have with your  health care provider. Document Released: 09/20/2005 Document Revised: 10/23/2016 Document Reviewed: 10/23/2016 Elsevier Interactive Patient Education  2018 Elsevier Inc.  

## 2020-07-30 NOTE — Progress Notes (Signed)
GYNECOLOGY ANNUAL PREVENTATIVE CARE ENCOUNTER NOTE  Subjective:   Trinadee Verhagen is a 39 y.o. [phone] female here for a annual gynecologic exam. Current complaints: wants STI testing, has some questions about contraception and STI testing. She is dating and using condoms. Has issues with yeast infections and uses boric acid suppositories.  Denies abnormal vaginal bleeding, discharge, pelvic pain, problems with intercourse or other gynecologic concerns. Accepts STI screen.   Gynecologic History Patient's last menstrual period was 07/24/2020 (exact date). Contraception: none Last Pap: 06/2019. Results: negative Last mammogram: n/a  Obstetric History OB History  Gravida Para Term Preterm AB Living  3 2 1 1 1 2   SAB TAB Ectopic Multiple Live Births  1     0 2    # Outcome Date GA Lbr Len/2nd Weight Sex Delivery Anes PTL Lv  3 Term 08/14/15 [redacted]w[redacted]d  7 lb 10.1 oz (3.46 kg) F CS-LTranv Spinal  LIV  2 Preterm 07/15/07 [redacted]w[redacted]d  6 lb 5 oz (2.863 kg) F CS-LTranv Spinal  LIV  1 SAB             Past Medical History:  Diagnosis Date  . BV (bacterial vaginosis) 07/2015   also candidial vaginosis 03/2017  . Fatty liver 05/2017   noted along with gallstone in neck of GB on CT 08/15/17, LFTs normal at that time.   . Morbid obesity ([place]) 2014   s/p gastric bypass in [place].    [facility]ms   . Normocytic anemia 02/2017  . Preterm labor    07/2007  . Vaginal Pap smear, abnormal     Past Surgical History:  Procedure Laterality Date  . BIOPSY  07/12/2019   Procedure: BIOPSY;  Surgeon: Jackquline Denmark, MD;  Location: [place];  Service: Endoscopy;;  . BREAST SURGERY     augmentation  . CESAREAN SECTION    . CESAREAN SECTION N/A 08/14/2015    REPEAT CESAREAN SECTION;  Surgeon: Donnamae Jude, MD;   . DILATION AND CURETTAGE OF UTERUS    . DILATION AND EVACUATION N/A 02/18/2017   for missed Ab.   . ESOPHAGOGASTRODUODENOSCOPY (EGD) WITH PROPOFOL N/A 08/16/2017   Procedure:  ESOPHAGOGASTRODUODENOSCOPY (EGD) WITH PROPOFOL;  Surgeon: Irene Shipper, MD;  Location: [facility];  Service: Endoscopy;  Laterality: N/A;  . ESOPHAGOGASTRODUODENOSCOPY (EGD) WITH PROPOFOL N/A 07/12/2019   Procedure: ESOPHAGOGASTRODUODENOSCOPY (EGD) WITH PROPOFOL;  Surgeon: Jackquline Denmark, MD;  Location: [facility] ENDOSCOPY;  Service: Endoscopy;  Laterality: N/A;  . GASTRIC BYPASS  2013 vs 2014   in [place]  . TONSILLECTOMY    . WRIST SURGERY Bilateral    carpal tunnel   No current outpatient medications on file prior to visit.   No current facility-administered medications on file prior to visit.    No Known Allergies  Social History   Socioeconomic History  . Marital status: Single    Spouse name: Not on file  . Number of children: Not on file  . Years of education: Not on file  . Highest education level: Not on file  Occupational History  . Not on file  Tobacco Use  . Smoking status: Former Smoker    Quit date: 08/04/2012    Years since quitting: 7.9  . Smokeless tobacco: Never Used  Vaping Use  . Vaping Use: Never used  Substance and Sexual Activity  . Alcohol use: Yes    Comment: OCCASIONAL  . Drug use: No  . Sexual activity: Yes    Birth control/protection: None  Other Topics Concern  . Not on file  Social History Narrative  . Not on file   Social Determinants of Health   Financial Resource Strain:   . Difficulty of Paying Living Expenses: Not on file  Food Insecurity: No Food Insecurity  . Worried About Charity fundraiser in the Last Year: Never true  . Ran Out of Food in the Last Year: Never true  Transportation Needs: Unmet Transportation Needs  . Lack of Transportation (Medical): Yes  . Lack of Transportation (Non-Medical): Yes  Physical Activity:   . Days of Exercise per Week: Not on file  . Minutes of Exercise per Session: Not on file  Stress:   . Feeling of Stress : Not on file  Social Connections:   . Frequency of Communication with Friends and  Family: Not on file  . Frequency of Social Gatherings with Friends and Family: Not on file  . Attends Religious Services: Not on file  . Active Member of Clubs or Organizations: Not on file  . Attends Archivist Meetings: Not on file  . Marital Status: Not on file  Intimate Partner Violence:   . Fear of Current or Ex-Partner: Not on file  . Emotionally Abused: Not on file  . Physically Abused: Not on file  . Sexually Abused: Not on file    Family History  Problem Relation Age of Onset  . Cancer Mother    The following portions of the patient's history were reviewed and updated as appropriate: allergies, current medications, past family history, past medical history, past social history, past surgical history and problem list.  Review of Systems Pertinent items are noted in HPI.   Objective:  BP (!) 137/94   Pulse 96   Ht 5\' 6"  (1.676 m)   Wt 221 lb (100.2 kg)   LMP 07/24/2020 (Exact Date)   Breastfeeding No   BMI 35.67 kg/m  CONSTITUTIONAL: Well-developed, well-nourished female in no acute distress.  HENT:  Normocephalic, atraumatic, External right and left ear normal. Oropharynx is clear and moist EYES: Conjunctivae and EOM are normal. Pupils are equal, round, and reactive to light. No scleral icterus.  NECK: Normal range of motion, supple, no masses.  Normal thyroid.  SKIN: Skin is warm and dry. No rash noted. Not diaphoretic. No erythema. No pallor. NEUROLOGIC: Alert and oriented to person, place, and time. Normal reflexes, muscle tone coordination. No cranial nerve deficit noted. PSYCHIATRIC: Normal mood and affect. Normal behavior. Normal judgment and thought content. CARDIOVASCULAR: Normal heart rate noted RESPIRATORY: Effort normal, no problems with respiration noted. BREASTS: Symmetric in size. No masses, skin changes, nipple drainage, or lymphadenopathy. Bilateral scars noted ABDOMEN: Soft, no distention noted.  No tenderness, rebound or guarding.  PELVIC:  Normal appearing external genitalia; normal appearing vaginal mucosa and cervix.  No abnormal discharge noted.  Pap smear obtained. Pelvic cultures obtained. Normal uterine size, no other palpable masses, no uterine or adnexal tenderness. MUSCULOSKELETAL: Normal range of motion. No tenderness.  No cyanosis, clubbing, or edema.  2+ distal pulses.  Exam done with chaperone present.  Assessment and Plan:   1. Well woman exam with routine gynecological exam - Cytology - PAP( [place]) - RPR - HIV Antibody (routine testing w rflx) - Hepatitis B Surface AntiGEN - Hepatitis C Antibody  2. Well woman exam Healthy female exam Had long discussion regarding use of routine boric acid for yeast infection, do not recommend using daily, but occasionally okay  3. Routine screening for STI (  sexually transmitted infection)  4. Cervical cancer screening Pap today  5. 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 Counseled about COVID-19 virus infection COVID-19 Vaccine Counseling: The patient was counseled on the potential benefits and lack of known risks of COVID vaccination, during pregnancy and breastfeeding, during today's visit. The patient's questions and concerns were addressed today, including safety of the vaccination and potential side effects as they have been published by ACOG and SM[facility]. The patient has been informed that there have not been any documented vaccine related injuries, deaths or birth defects to infant or mom after receiving the COVID-19 vaccine to date. The patient has been made aware that although she is not at increased risk of contracting COVID-19 during pregnancy, she is at increased risk of developing severe disease and complications if she contracts COVID-19 while pregnant. All patient questions were addressed during our visit today. The patient is  still unsure of her decision for vaccination.     Will follow up results of pap smear/STI screen and manage accordingly. Encouraged improvement in diet and exercise.  Accepts STI screen. COVID vaccine undecided Mammogram n/a Referral for colonoscopy n/a Flu vaccine not today   Routine preventative health maintenance measures emphasized. Please refer to After Visit Summary for other counseling recommendations.   Total face-to-face time with patient: 35 minutes. Over 50% of encounter was spent on counseling and coordination of care.   Feliz Beam, M.D. Attending Center for [employer] [facility] manager)

## 2020-07-31 LAB — HEPATITIS C ANTIBODY: Hep C Virus Ab: <0.1 s/co ratio (ref 0.0–0.9)

## 2020-07-31 LAB — HIV ANTIBODY (ROUTINE TESTING W REFLEX): HIV Screen 4th Generation wRfx: NONREACTIVE

## 2020-07-31 LAB — RPR: RPR Ser Ql: NONREACTIVE

## 2020-07-31 LAB — HEPATITIS B SURFACE ANTIGEN: Hepatitis B Surface Ag: NEGATIVE

## 2020-08-01 LAB — CYTOLOGY - PAP
Chlamydia: NEGATIVE
Comment: NEGATIVE
Comment: NEGATIVE
Comment: NEGATIVE
Comment: NORMAL
Diagnosis: NEGATIVE
High risk HPV: NEGATIVE
Neisseria Gonorrhea: NEGATIVE
Trichomonas: NEGATIVE

## 2020-08-02 IMAGING — US US PELVIS COMPLETE WITH TRANSVAGINAL
1 series · 15 of 25 positions shown · non-contrast
Comparison: CT 11/21/2017

CLINICAL DATA: Pelvic pain, menorrhagia



[Series 1: us pelvis complete with transvaginal · 15 of 50 slices shown]
[im 1/50]
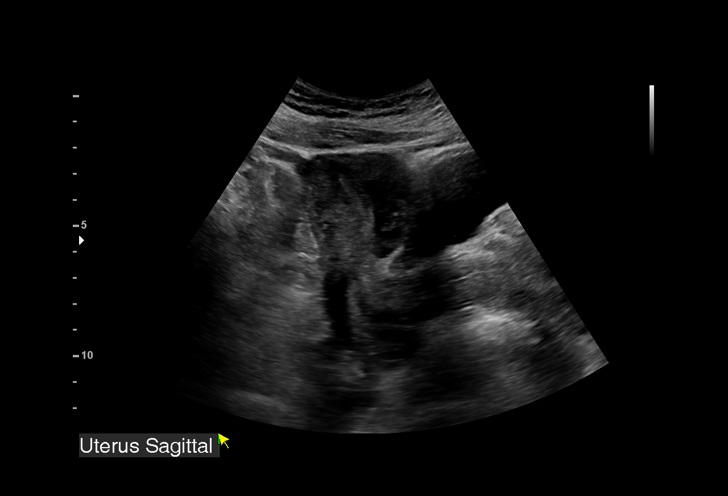
[im 5/50]
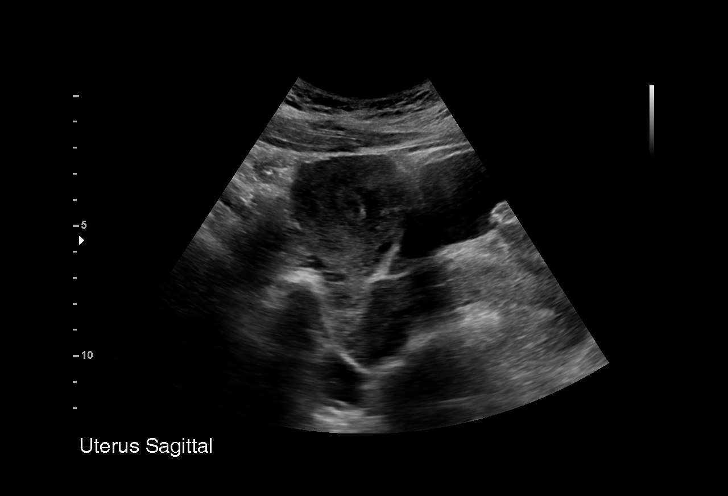
[im 9/50]
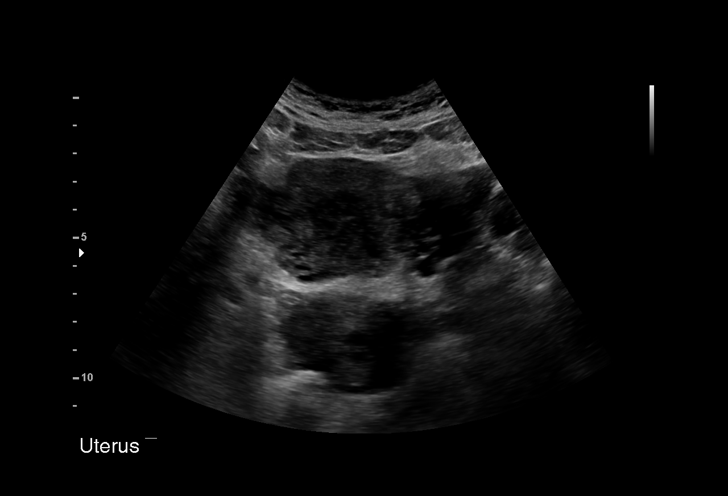
[im 11/50]
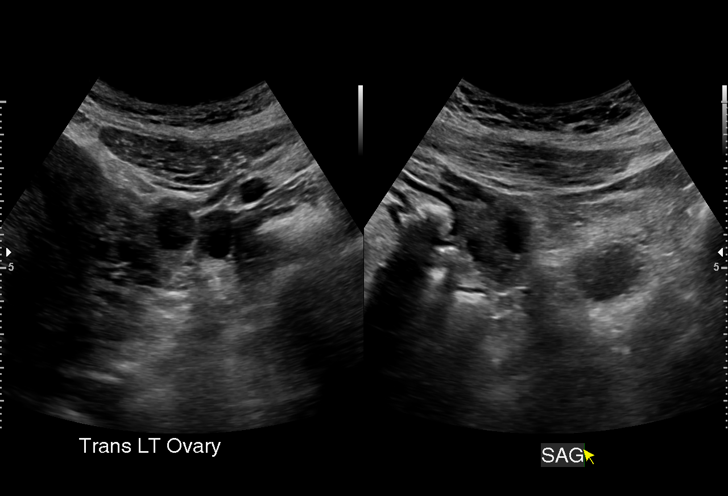
[im 15/50]
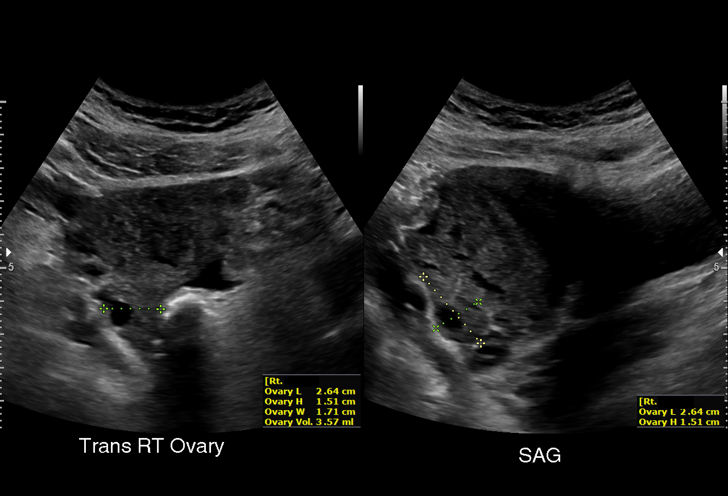
[im 19/50]
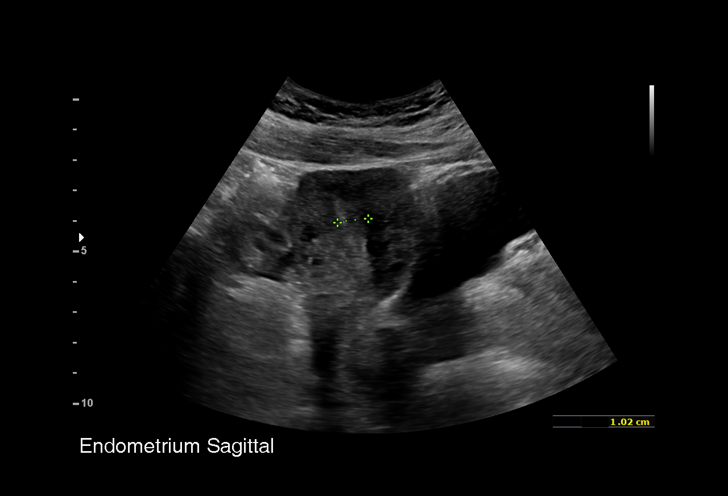
[im 21/50]
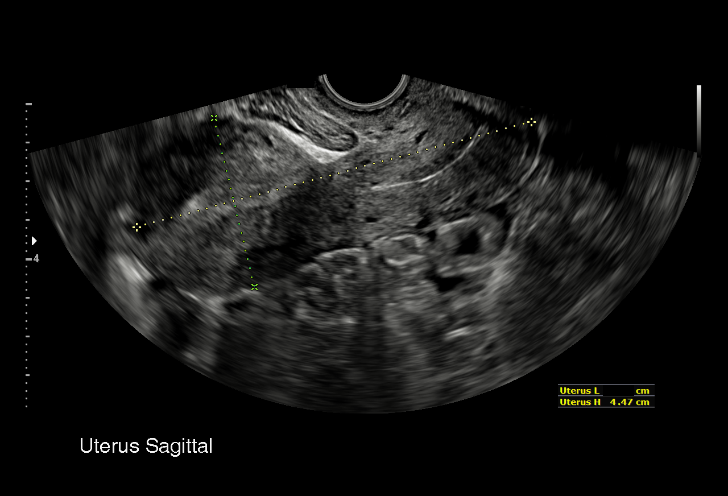
[im 25/50]
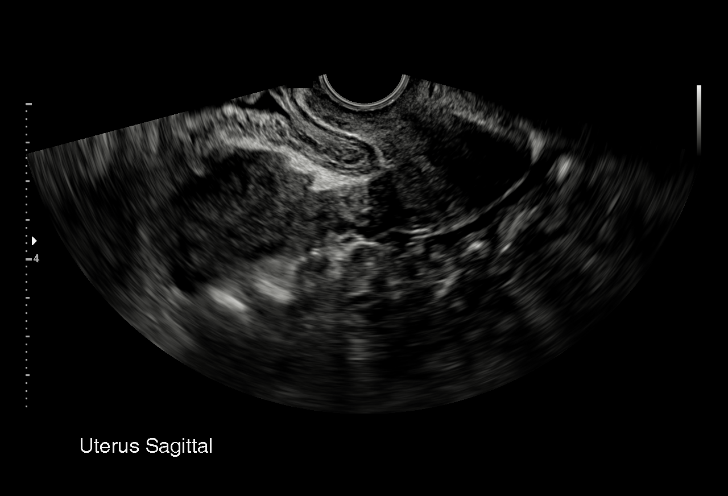
[im 29/50]
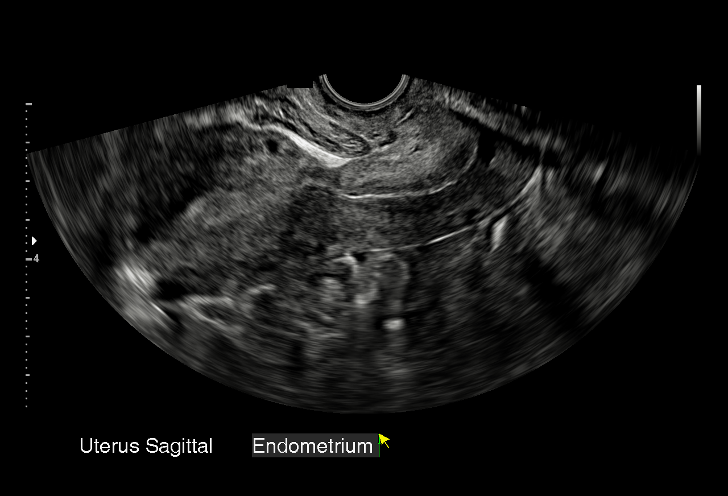
[im 31/50]
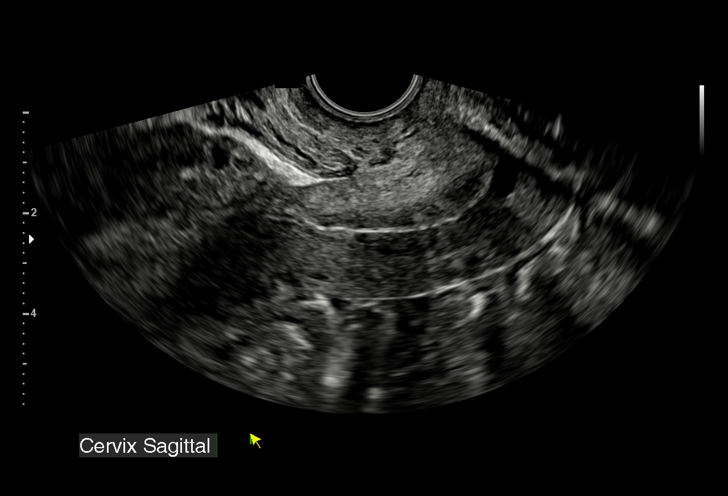
[im 35/50]
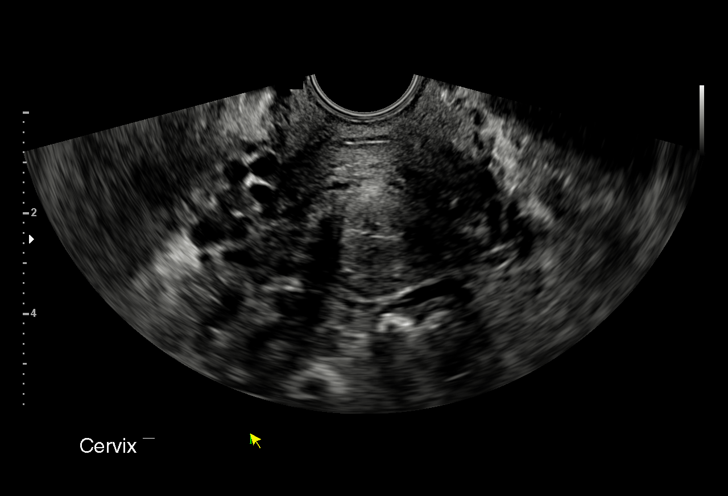
[im 39/50]
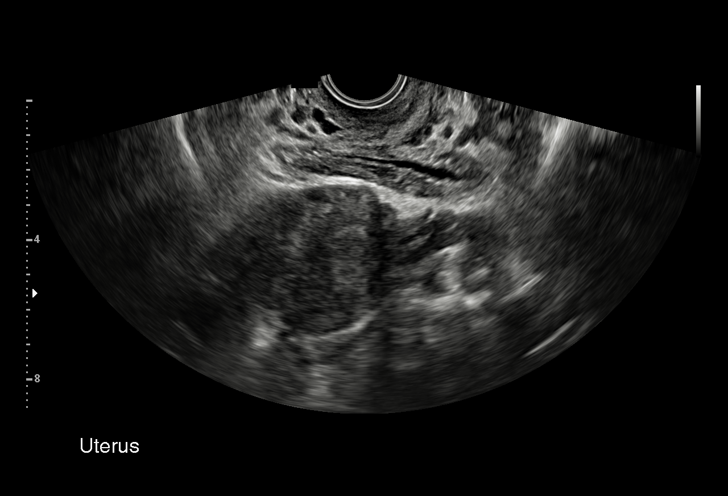
[im 41/50]
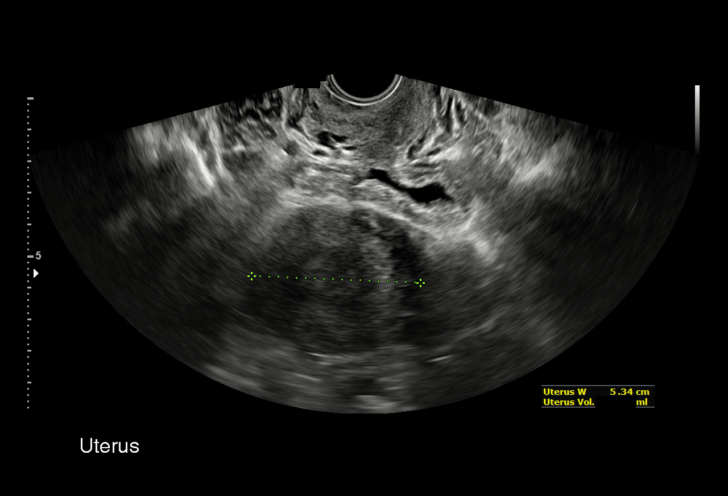
[im 45/50]
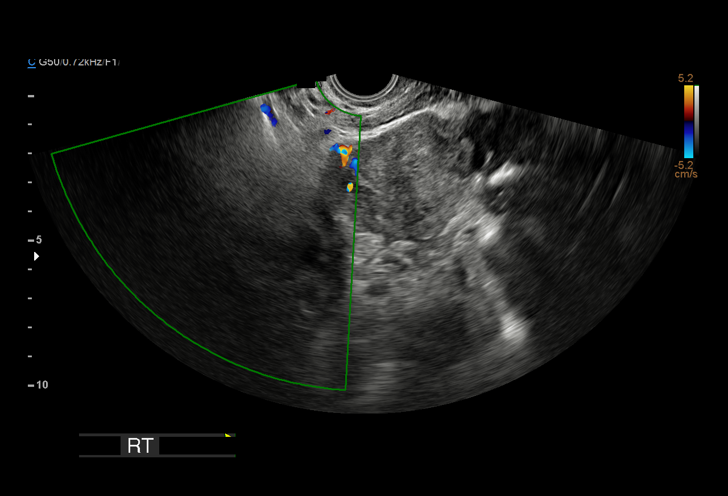
[im 50/50]
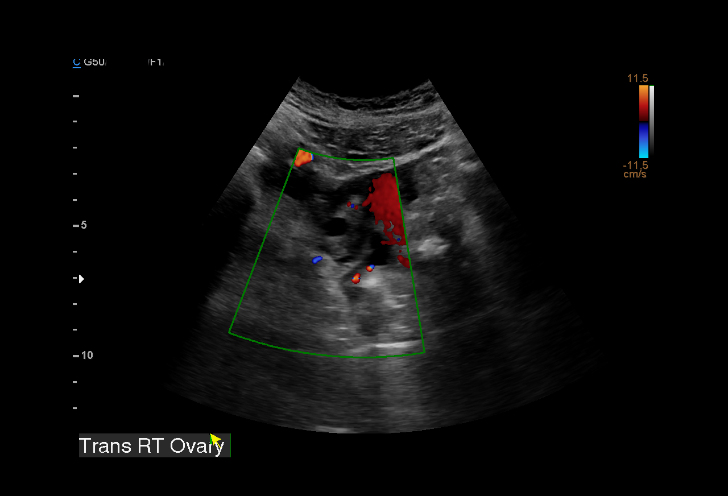

[15 of 25 positions shown; findings below may reference images not displayed]

FINDINGS: Uterus

Measurements: 10.5 x 4.5 x 5.3 cm = volume: 131 mL. No fibroids or
other mass visualized.

Endometrium

Thickness: 9 mm.  No focal abnormality visualized.

Right ovary

Measurements: 2.6 x 1.5 x 1.7 cm = volume: 3.6 mL. Normal
appearance/no adnexal mass.

Left ovary

Measurements: 2.8 x 2.2 x 2.2 cm = volume: 6.8 mL. Normal
appearance/no adnexal mass.

Other findings

No abnormal free fluid.
IMPRESSION: Normal pelvic ultrasound.

## 2020-08-12 IMAGING — CT CT ABD-PELV W/ CM
2 of 4 series · 17 of 46 positions shown, 19 images · IV contrast (APPLIED)
Comparison: 11/21/2017

CLINICAL DATA: Acute generalized abdominal pain

EXAM:
CT ABDOMEN AND PELVIS WITH CONTRAST
TECHNIQUE: Multidetector CT imaging of the abdomen and pelvis was performed
using the standard protocol following bolus administration of
intravenous contrast.
CONTRAST:  100mL OMNIPAQUE IOHEXOL 300 MG/ML  SOLN

[Series 3: abdomen 5.0 · axial · 0.83mm/px · z∈[+727,+1152]mm · 14 of 95 slices shown, 16 images]
[im 5/95  soft-tissue]
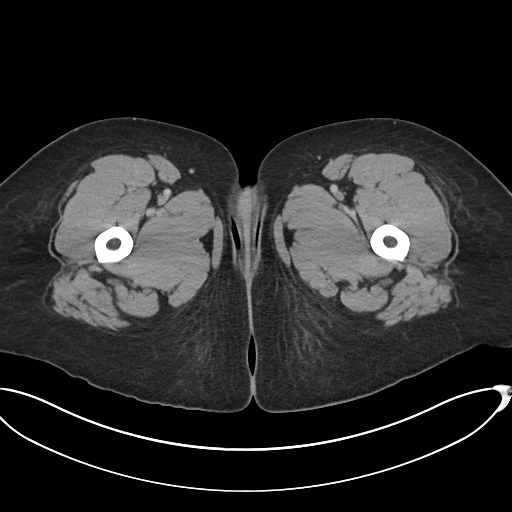
[im 5/95  bone]
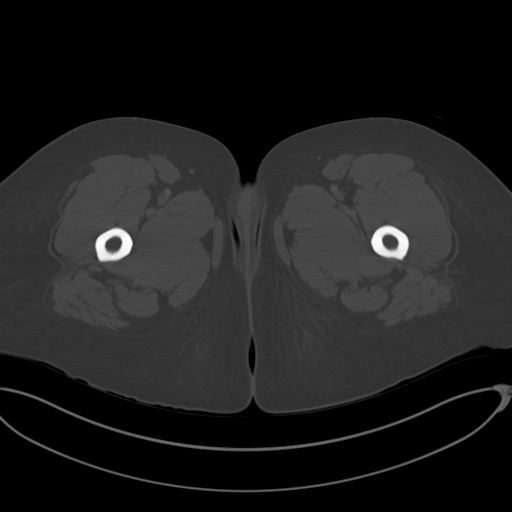
[im 15/95  soft-tissue]
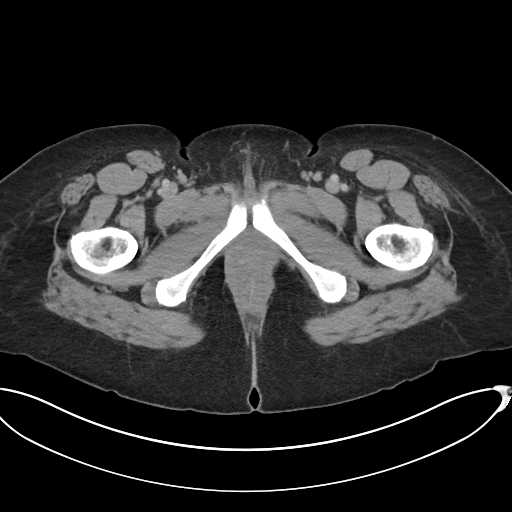
[im 19/95  soft-tissue]
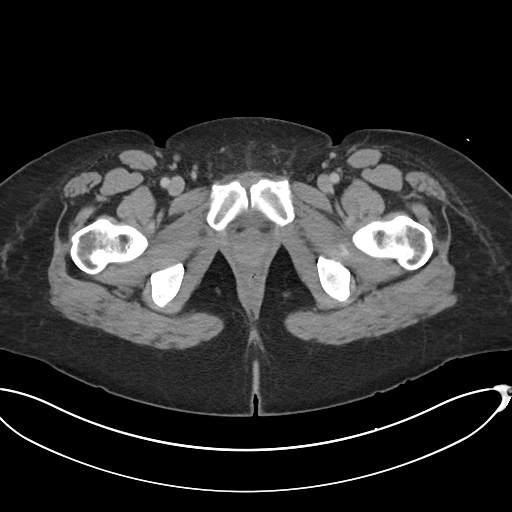
[im 24/95  soft-tissue]
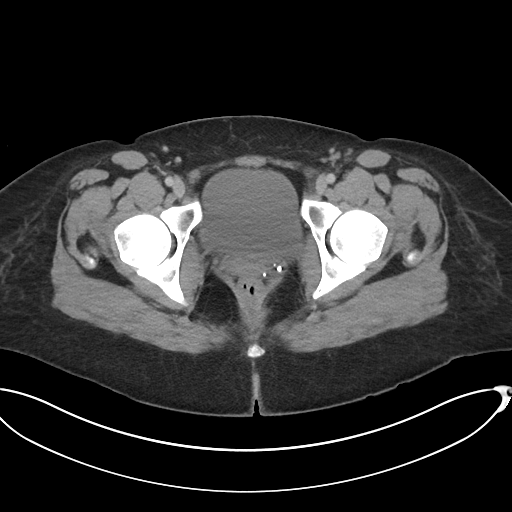
[im 33/95  soft-tissue]
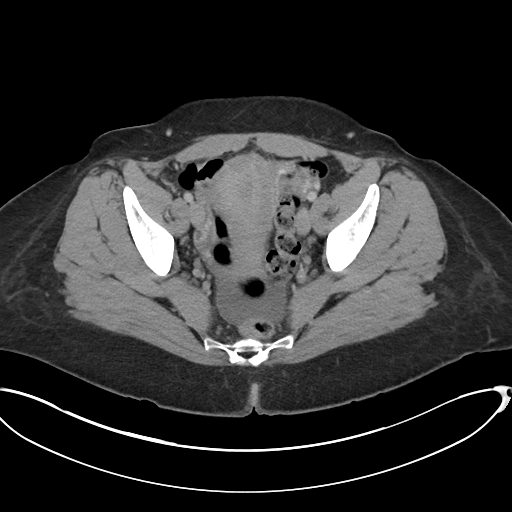
[im 38/95  soft-tissue]
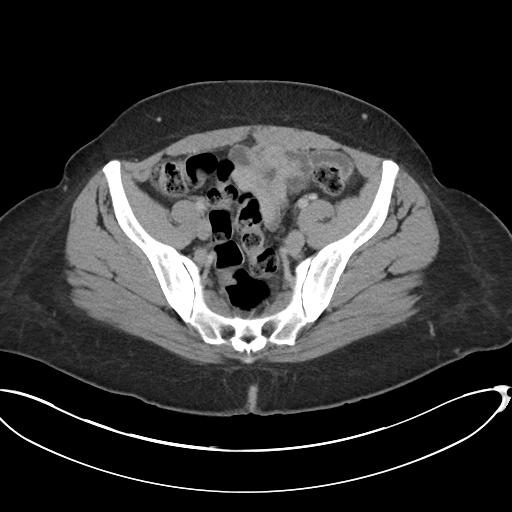
[im 43/95  soft-tissue]
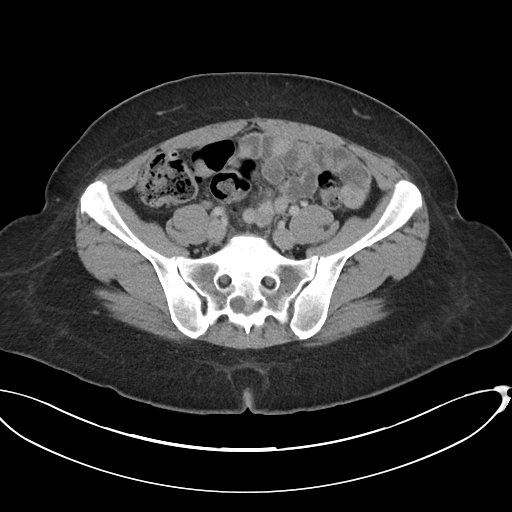
[im 52/95  soft-tissue]
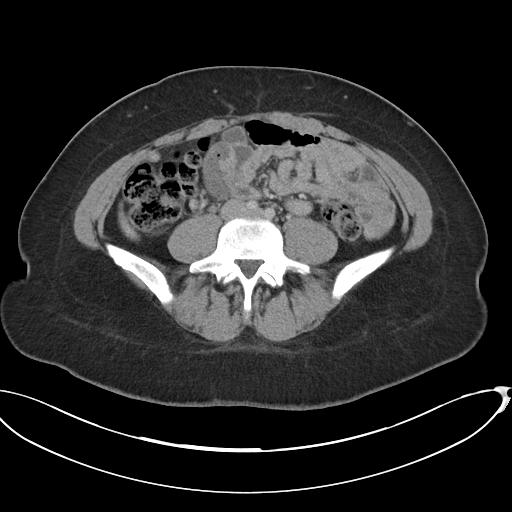
[im 57/95  soft-tissue]
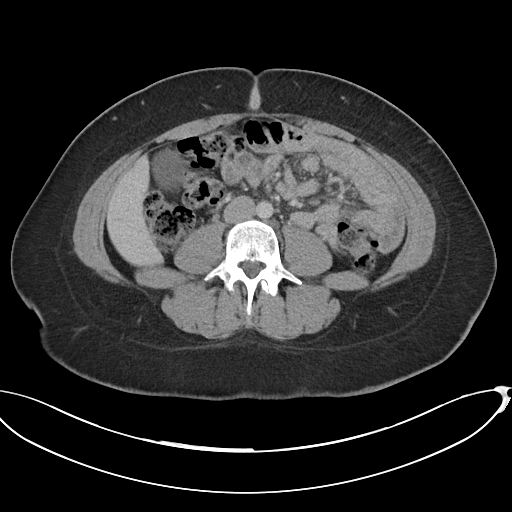
[im 57/95  bone]
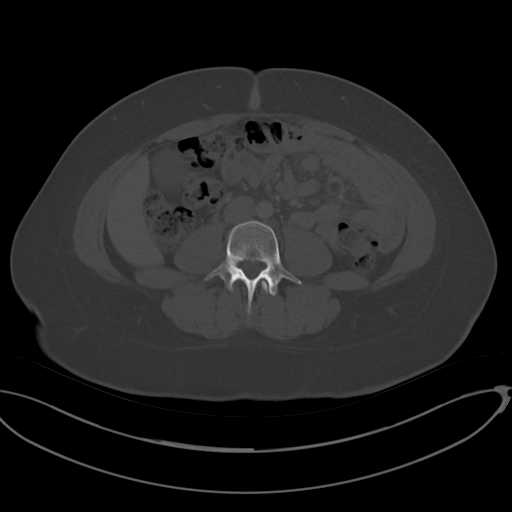
[im 62/95  soft-tissue]
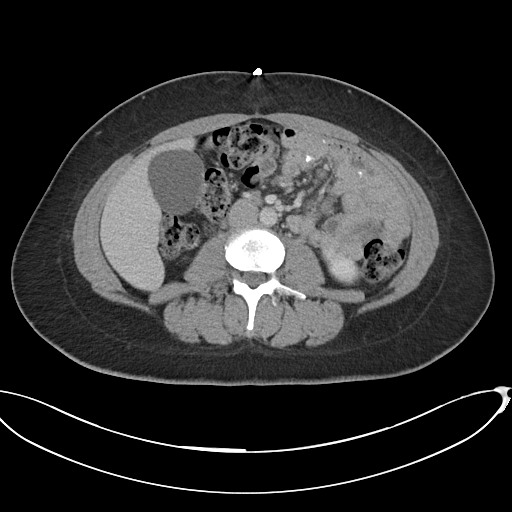
[im 71/95  soft-tissue]
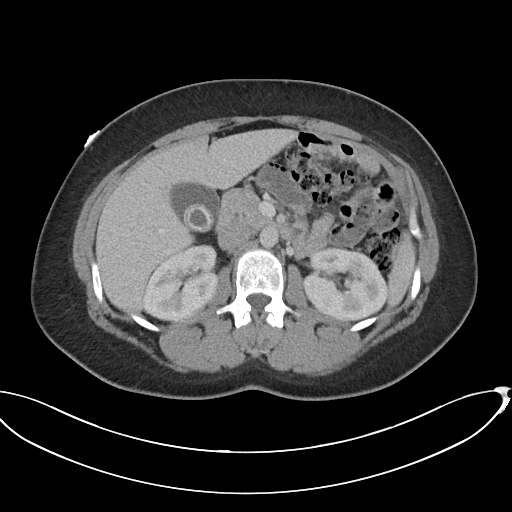
[im 76/95  soft-tissue]
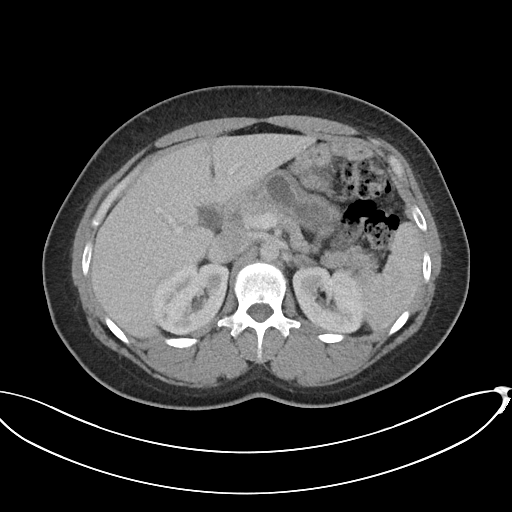
[im 80/95  soft-tissue]
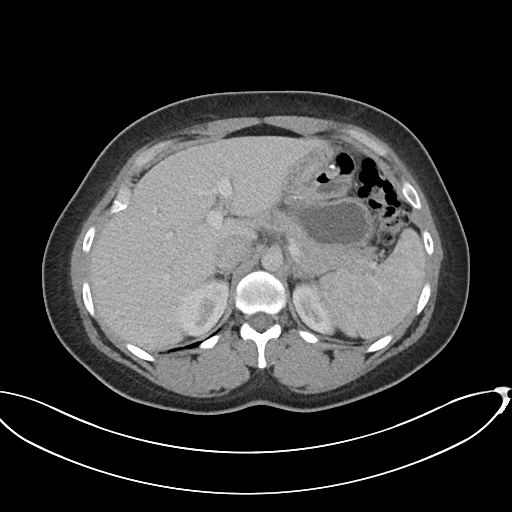
[im 90/95  soft-tissue]
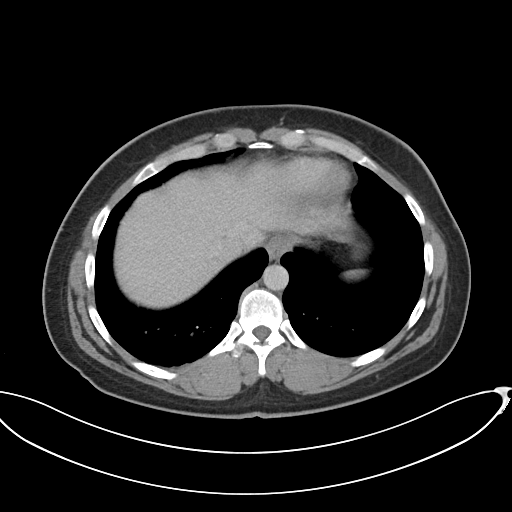

[Series 6: abdomen 3.0 mpr cor · coronal · 0.97mm/px · 3 of 105 slices shown]
[im 35/105  soft-tissue]
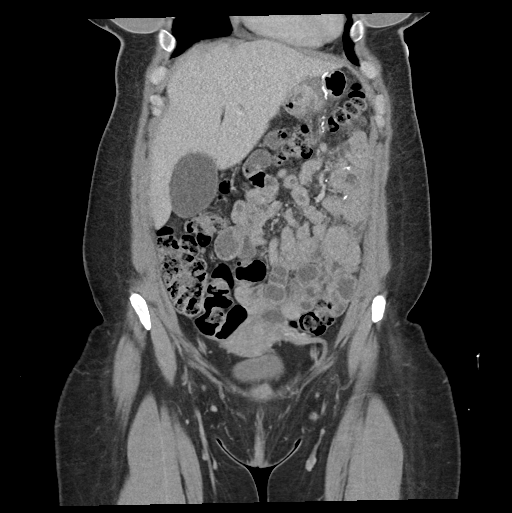
[im 47/105  soft-tissue]
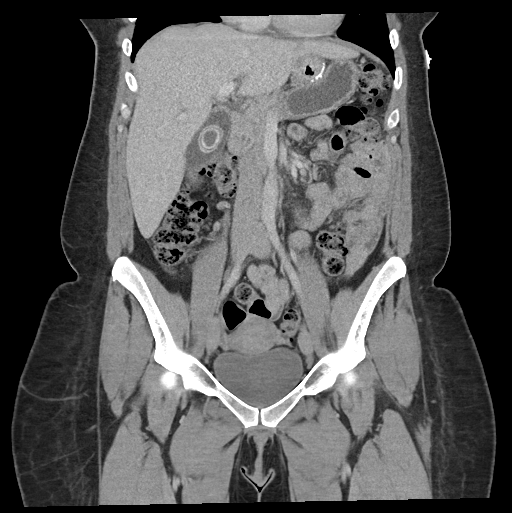
[im 58/105  soft-tissue]
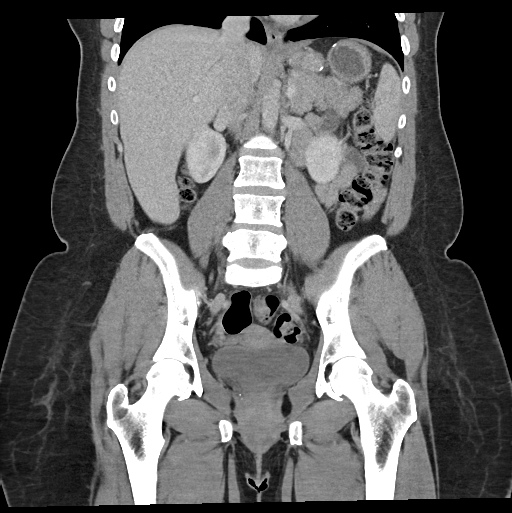

[17 of 46 positions shown; findings below may reference images not displayed]

FINDINGS: Lower chest: Partially covered breast implants. No pathologic
finding.

Hepatobiliary: No focal liver abnormality.Lamellated stone in the
gallbladder. No evidence of acute cholecystitis.

Pancreas: Unremarkable.

Spleen: Unremarkable.

Adrenals/Urinary Tract: Negative adrenals. No hydronephrosis or
stone. Unremarkable bladder.

Stomach/Bowel: Gastric bypass with mild wall thickening and
redemonstrated jejunal outpouching at the gastrojejunal anastomosis.
Fat inflammation is stable or mildly improved from prior. No bowel
obstruction. No appendicitis.

Vascular/Lymphatic: No acute vascular abnormality. No mass or
adenopathy.

Reproductive:No pathologic findings.  Corpus luteum on both sides.

Other: Small volume pelvic fluid, usually considered physiologic in
this setting.

Musculoskeletal: No acute abnormalities.
IMPRESSION: 1. Gastric bypass with chronic inflammatory changes at the
gastrojejunal anastomosis where there may be a chronic marginal
ulcer. No perforation.
2. Free pelvic fluid, usually physiologic.
3. Cholelithiasis without evidence of acute cholecystitis.

## 2021-07-09 ENCOUNTER — Ambulatory Visit: Payer: Medicaid Other | Admitting: Obstetrics & Gynecology

## 2021-08-21 ENCOUNTER — Other Ambulatory Visit: Payer: Self-pay

## 2021-08-21 ENCOUNTER — Encounter: Payer: Self-pay | Admitting: Obstetrics & Gynecology

## 2021-08-21 ENCOUNTER — Other Ambulatory Visit (HOSPITAL_COMMUNITY)
Admission: RE | Admit: 2021-08-21 | Discharge: 2021-08-21 | Disposition: A | Discharge status: Home / Self Care | Payer: Medicaid Other | Source: Ambulatory Visit | Attending: Obstetrics & Gynecology | Admitting: Obstetrics & Gynecology

## 2021-08-21 ENCOUNTER — Ambulatory Visit (INDEPENDENT_AMBULATORY_CARE_PROVIDER_SITE_OTHER): Payer: Medicaid Other | Admitting: Obstetrics & Gynecology

## 2021-08-21 VITALS — BP 113/81 | HR 79 | Ht 66.0 in | Wt 229.5 lb

## 2021-08-21 DIAGNOSIS — Z01419 Encounter for gynecological examination (general) (routine) without abnormal findings: Secondary | ICD-10-CM

## 2021-08-21 DIAGNOSIS — B3731 Acute candidiasis of vulva and vagina: Secondary | ICD-10-CM

## 2021-08-21 DIAGNOSIS — Z113 Encounter for screening for infections with a predominantly sexual mode of transmission: Secondary | ICD-10-CM

## 2021-08-21 NOTE — Progress Notes (Signed)
GYNECOLOGY ANNUAL PREVENTATIVE CARE ENCOUNTER NOTE  History:     Lane Kjos is a 40 y.o. [phone] female here for a routine annual gynecologic exam.  Current complaints: none.   Denies abnormal vaginal bleeding, discharge, pelvic pain, problems with intercourse or other gynecologic concerns.    Gynecologic History Patient's last menstrual period was 08/04/2021. Contraception: none Last Pap: 07/30/2020. Result was normal with negative HPV  Obstetric History OB History  Gravida Para Term Preterm AB Living  3 2 1 1 1 2   SAB IAB Ectopic Multiple Live Births  1     0 2    # Outcome Date GA Lbr Len/2nd Weight Sex Delivery Anes PTL Lv  3 Term 08/14/15 [redacted]w[redacted]d  7 lb 10.1 oz (3.46 kg) F CS-LTranv Spinal  LIV  2 Preterm 07/15/07 [redacted]w[redacted]d  6 lb 5 oz (2.863 kg) F CS-LTranv Spinal  LIV  1 SAB             Past Medical History:  Diagnosis Date   BV (bacterial vaginosis) 07/2015   also candidial vaginosis 03/2017   Fatty liver 05/2017   noted along with gallstone in neck of GB on CT 08/15/17, LFTs normal at that time.    Morbid obesity ([place]) 2014   s/p gastric bypass in [place].     mrsa    Normocytic anemia 02/2017   Preterm labor    07/2007   Vaginal Pap smear, abnormal     Past Surgical History:  Procedure Laterality Date   BIOPSY  07/12/2019   Procedure: BIOPSY;  Surgeon: Jackquline Denmark, MD;  Location: [facility] ENDOSCOPY;  Service: Endoscopy;;   BREAST SURGERY     augmentation   CESAREAN SECTION     CESAREAN SECTION N/A 08/14/2015    REPEAT CESAREAN SECTION;  Surgeon: Donnamae Jude, MD;    DILATION AND CURETTAGE OF UTERUS     DILATION AND EVACUATION N/A 02/18/2017   for missed Ab.    ESOPHAGOGASTRODUODENOSCOPY (EGD) WITH PROPOFOL N/A 08/16/2017   Procedure: ESOPHAGOGASTRODUODENOSCOPY (EGD) WITH PROPOFOL;  Surgeon: Irene Shipper, MD;  Location: [facility];  Service: Endoscopy;  Laterality: N/A;   ESOPHAGOGASTRODUODENOSCOPY (EGD) WITH PROPOFOL N/A 07/12/2019   Procedure:  ESOPHAGOGASTRODUODENOSCOPY (EGD) WITH PROPOFOL;  Surgeon: Jackquline Denmark, MD;  Location: [facility] ENDOSCOPY;  Service: Endoscopy;  Laterality: N/A;   GASTRIC BYPASS  2013 vs 2014   in [place] Bilateral    carpal tunnel   No current outpatient medications on file prior to visit.   No current facility-administered medications on file prior to visit.   No Known Allergies  Social History:  reports that she quit smoking about 9 years ago. Her smoking use included cigarettes. She has never used smokeless tobacco. She reports current alcohol use. She reports that she does not use drugs.  Family History  Problem Relation Age of Onset   Cancer Mother     The following portions of the patient's history were reviewed and updated as appropriate: allergies, current medications, past family history, past medical history, past social history, past surgical history and problem list.  Review of Systems Pertinent items noted in HPI and remainder of comprehensive ROS otherwise negative.  Physical Exam:  BP 113/81   Pulse 79   Ht 5\' 6"  (1.676 m)   Wt 229 lb 8 oz (104.1 kg)   LMP 08/04/2021   BMI 37.04 kg/m  CONSTITUTIONAL: Well-developed, well-nourished female in no acute distress.  HENT:  Normocephalic, atraumatic, External right and left ear normal.  EYES: Conjunctivae and EOM are normal. Pupils are equal, round, and reactive to light. No scleral icterus.  NECK: Normal range of motion, supple, no masses.  Normal thyroid.  SKIN: Skin is warm and dry. No rash noted. Not diaphoretic. No erythema. No pallor. MUSCULOSKELETAL: Normal range of motion. No tenderness.  No cyanosis, clubbing, or edema. NEUROLOGIC: Alert and oriented to person, place, and time. Normal reflexes, muscle tone coordination.  PSYCHIATRIC: Normal mood and affect. Normal behavior. Normal judgment and thought content. CARDIOVASCULAR: Normal heart rate noted, regular rhythm RESPIRATORY: Clear to  auscultation bilaterally. Effort and breath sounds normal, no problems with respiration noted. BREASTS: Symmetric in size. No masses, tenderness, skin changes, nipple drainage, or lymphadenopathy bilaterally. Performed in the presence of a chaperone. ABDOMEN: Soft, no distention noted.  No tenderness, rebound or guarding.  PELVIC: Normal appearing external genitalia and urethral meatus; normal appearing vaginal mucosa and cervix.  No abnormal vaginal discharge noted.  Pap smear obtained.  Normal uterine size, no other palpable masses, no uterine or adnexal tenderness.  Performed in the presence of a chaperone.   Assessment and Plan:     1. Screening for STD (sexually transmitted disease) Desires STI screen, will follow up results and manage accordingly. - HIV Antibody (routine testing w rflx) - RPR - Hepatitis B Surface AntiGEN - Hepatitis C Antibody - Cervicovaginal ancillary only( [place])  2. Well woman exam with routine gynecological exam - CBC - Comprehensive metabolic panel - Lipid panel - Hemoglobin A1c - TSH - Cytology - PAP Desired preventative healthcare maintenance labs. Will follow up results of pap smear and manage accordingly. Normal breast examination. Routine preventative health maintenance measures emphasized. Please refer to After Visit Summary for other counseling recommendations.      Verita Schneiders, MD, [place] for [employer], [place]

## 2021-08-22 LAB — LIPID PANEL
Chol/HDL Ratio: 2.4 ratio (ref 0.0–4.4)
Cholesterol, Total: 172 mg/dL (ref 100–199)
HDL: 72 mg/dL (ref >39)
LDL Chol Calc (NIH): 89 mg/dL (ref 0–99)
Triglycerides: 55 mg/dL (ref 0–149)
VLDL Cholesterol Cal: 11 mg/dL (ref 5–40)

## 2021-08-22 LAB — HEMOGLOBIN A1C
Est. average glucose Bld gHb Est-mCnc: 117 mg/dL
Hgb A1c MFr Bld: 5.7 % — ABNORMAL HIGH (ref 4.8–5.6)

## 2021-08-22 LAB — HEPATITIS C ANTIBODY: Hep C Virus Ab: <0.1 s/co ratio (ref 0.0–0.9)

## 2021-08-22 LAB — CBC
Hematocrit: 32.7 % — ABNORMAL LOW (ref 34.0–46.6)
Hemoglobin: 10.6 g/dL — ABNORMAL LOW (ref 11.1–15.9)
MCH: 24.3 pg — ABNORMAL LOW (ref 26.6–33.0)
MCHC: 32.4 g/dL (ref 31.5–35.7)
MCV: 75 fL — ABNORMAL LOW (ref 79–97)
Platelets: 361 10*3/uL (ref 150–450)
RBC: 4.36 x10E6/uL (ref 3.77–5.28)
RDW: 17.1 % — ABNORMAL HIGH (ref 11.7–15.4)
WBC: 6.3 10*3/uL (ref 3.4–10.8)

## 2021-08-22 LAB — COMPREHENSIVE METABOLIC PANEL
ALT: 11 IU/L (ref 0–32)
AST: 24 IU/L (ref 0–40)
Albumin/Globulin Ratio: 2 (ref 1.2–2.2)
Albumin: 4.5 g/dL (ref 3.8–4.8)
Alkaline Phosphatase: 61 IU/L (ref 44–121)
BUN/Creatinine Ratio: 13 (ref 9–23)
BUN: 11 mg/dL (ref 6–20)
Bilirubin Total: <0.2 mg/dL (ref 0.0–1.2)
CO2: 23 mmol/L (ref 20–29)
Calcium: 9.4 mg/dL (ref 8.7–10.2)
Chloride: 106 mmol/L (ref 96–106)
Creatinine, Ser: 0.86 mg/dL (ref 0.57–1.00)
Globulin, Total: 2.3 g/dL (ref 1.5–4.5)
Glucose: 91 mg/dL (ref 70–99)
Potassium: 5 mmol/L (ref 3.5–5.2)
Sodium: 143 mmol/L (ref 134–144)
Total Protein: 6.8 g/dL (ref 6.0–8.5)
eGFR: 88 mL/min/{1.73_m2} (ref >59)

## 2021-08-22 LAB — TSH: TSH: 1.71 u[IU]/mL (ref 0.450–4.500)

## 2021-08-22 LAB — HIV ANTIBODY (ROUTINE TESTING W REFLEX): HIV Screen 4th Generation wRfx: NONREACTIVE

## 2021-08-22 LAB — RPR: RPR Ser Ql: NONREACTIVE

## 2021-08-22 LAB — HEPATITIS B SURFACE ANTIGEN: Hepatitis B Surface Ag: NEGATIVE

## 2021-08-24 ENCOUNTER — Encounter: Payer: Self-pay | Admitting: Obstetrics & Gynecology

## 2021-08-24 LAB — CERVICOVAGINAL ANCILLARY ONLY
Bacterial Vaginitis (gardnerella): NEGATIVE
Candida Glabrata: NEGATIVE
Candida Vaginitis: POSITIVE — AB
Chlamydia: NEGATIVE
Comment: NEGATIVE
Comment: NEGATIVE
Comment: NEGATIVE
Comment: NEGATIVE
Comment: NEGATIVE
Comment: NORMAL
Neisseria Gonorrhea: NEGATIVE
Trichomonas: NEGATIVE

## 2021-08-24 MED ORDER — FLUCONAZOLE 150 MG PO TABS: 150.0000 mg | ORAL_TABLET | Freq: Once | ORAL | 3 refills | Status: AC

## 2021-08-24 NOTE — Addendum Note (Signed)
Addended by: Verita Schneiders A on: 08/24/2021 04:36 PM   Modules accepted: Orders

## 2021-08-26 NOTE — Progress Notes (Signed)
SDOH, PHQ9, GAD 7 entered. Reported to Vesta Mixer, [facility] Clinician.

## 2021-08-28 LAB — CYTOLOGY - PAP
Comment: NEGATIVE
Diagnosis: NEGATIVE
High risk HPV: NEGATIVE

## 2022-07-27 ENCOUNTER — Encounter: Payer: Self-pay | Admitting: Obstetrics and Gynecology

## 2022-07-27 ENCOUNTER — Ambulatory Visit (INDEPENDENT_AMBULATORY_CARE_PROVIDER_SITE_OTHER): Payer: Medicaid Other | Admitting: Obstetrics and Gynecology

## 2022-07-27 ENCOUNTER — Other Ambulatory Visit: Payer: Self-pay

## 2022-07-27 VITALS — BP 115/71 | HR 102

## 2022-07-27 DIAGNOSIS — N939 Abnormal uterine and vaginal bleeding, unspecified: Secondary | ICD-10-CM

## 2022-07-27 DIAGNOSIS — Z113 Encounter for screening for infections with a predominantly sexual mode of transmission: Secondary | ICD-10-CM | POA: Diagnosis not present

## 2022-07-27 NOTE — Progress Notes (Signed)
GYNECOLOGY VISIT  Patient name: Patricia Wise MRN [id number]  Date of birth: 26-Oct-1980 Chief Complaint:   Vaginal Bleeding  History:  Patricia Wise is a 41 y.o. [id number] being seen today for AUB.  Irregular bleeding Current complaints: reports heavy prolonged bleeding. Bleeding every other week for at least 6 months. Menses are typically heavy but became more frequent. No significant pain. Now having acne associated with menses.  She reports prior to the 65-month  her menses would be 7 days heavy, but not particularly painful and regularly once a month. Not sure has completed childbearing.  She does report donating plasma very frequently.  She reports that they check her blood level at the plasma donation center.  She reports increased fatigue, taking iron as needed.   Past Medical History:  Diagnosis Date   Fatty liver 05/2017   noted along with gallstone in neck of GB on CT 08/15/17, LFTs normal at that time.    [place]    Normocytic anemia 02/2017   Prediabetes    Vaginal Pap smear, abnormal     Past Surgical History:  Procedure Laterality Date   BIOPSY  07/12/2019   Procedure: BIOPSY;  Surgeon: GJackquline Denmark MD;  [facility]  Service: Endoscopy;;   BREAST SURGERY     augmentation   CESAREAN SECTION     CESAREAN SECTION N/A 08/14/2015    REPEAT CESAREAN SECTION;  Surgeon: TDonnamae Jude MD;    DILATION AND CURETTAGE OF UTERUS     DILATION AND EVACUATION N/A 02/18/2017   for missed Ab.    ESOPHAGOGASTRODUODENOSCOPY (EGD) WITH PROPOFOL N/A 08/16/2017   Procedure: ESOPHAGOGASTRODUODENOSCOPY (EGD) WITH PROPOFOL;  Surgeon: PIrene Shipper MD;  [facility];  Service: Endoscopy;  Laterality: N/A;   ESOPHAGOGASTRODUODENOSCOPY (EGD) WITH PROPOFOL N/A 07/12/2019   Procedure: ESOPHAGOGASTRODUODENOSCOPY (EGD) WITH PROPOFOL;  Surgeon: GJackquline Denmark MD;  [facility];  Service: Endoscopy;   Laterality: N/A;   [place]    The following portions of the patient's history were reviewed and updated as appropriate: allergies, current medications, past family history, past medical history, past social history, past surgical history and problem list.   Health Maintenance:   Last pap 08/2021. Results were: NILM w/ HRHPV negative. H/O abnormal pap: yes  Review of Systems:  Pertinent items are noted in HPI. Comprehensive review of systems was otherwise negative.   Objective:  Physical Exam BP 115/71   Pulse (!) 102   LMP 07/16/2022    Physical Exam Vitals and nursing note reviewed.  Constitutional:      Appearance: Normal appearance.  HENT:     Head: Normocephalic and atraumatic.  Cardiovascular:     Rate and Rhythm: Normal rate and regular rhythm.  Pulmonary:     Effort: Pulmonary effort is normal.     Breath sounds: Normal breath sounds.  Skin:    General: Skin is warm and dry.  Neurological:     General: No focal deficit present.     Mental Status: She is alert.  Psychiatric:        Mood and Affect: Mood normal.        Behavior: Behavior normal.        Thought Content: Thought content normal.  Judgment: Judgment normal.     Labs and Imaging Lab Results  Component Value Date   WBC 6.3 08/21/2021   HGB 10.6 (L) 08/21/2021   HCT 32.7 (L) 08/21/2021   MCV 75 (L) 08/21/2021   PLT 361 08/21/2021    Assessment & Plan:   1. Routine screening for STI (sexually transmitted infection) Screening test ordered today - RPR+HBsAg+HCVAb+... - Cervicovaginal ancillary only  2. Abnormal uterine bleeding (AUB) Discussed different options for management of abnormal uterine bleeding.  Will obtain additional labs as part of work-up today.  Pelvic ultrasound ordered today as well to assess for structural contributions of bleeding.  Patient also desires follow-up with  primary care, will seek out one on her own.  Will do EMB at next visit.  - CBC - Follicle stimulating hormone - Hemoglobin A1c - US PELVIC COMPLETE WITH TRANSVAGINAL; Future  Routine preventative health maintenance measures emphasized.  Darliss Cheney, MD [facility]

## 2022-07-28 ENCOUNTER — Other Ambulatory Visit: Payer: Self-pay | Admitting: Obstetrics and Gynecology

## 2022-07-28 DIAGNOSIS — A749 Chlamydial infection, unspecified: Secondary | ICD-10-CM

## 2022-07-28 DIAGNOSIS — B9689 Other specified bacterial agents as the cause of diseases classified elsewhere: Secondary | ICD-10-CM

## 2022-07-28 LAB — CERVICOVAGINAL ANCILLARY ONLY
Bacterial Vaginitis (gardnerella): POSITIVE — AB
Candida Glabrata: NEGATIVE
Candida Vaginitis: NEGATIVE
Chlamydia: POSITIVE — AB
Comment: NEGATIVE
Comment: NEGATIVE
Comment: NEGATIVE
Comment: NEGATIVE
Comment: NEGATIVE
Comment: NORMAL
Neisseria Gonorrhea: NEGATIVE
Trichomonas: NEGATIVE

## 2022-07-28 LAB — FOLLICLE STIMULATING HORMONE: FSH: 3.2 m[IU]/mL

## 2022-07-28 LAB — RPR+HBSAG+HCVAB+...
HIV Screen 4th Generation wRfx: NONREACTIVE
Hep C Virus Ab: NONREACTIVE
Hepatitis B Surface Ag: NEGATIVE
RPR Ser Ql: NONREACTIVE

## 2022-07-28 LAB — HEMOGLOBIN A1C
Est. average glucose Bld gHb Est-mCnc: 120 mg/dL
Hgb A1c MFr Bld: 5.8 % — ABNORMAL HIGH (ref 4.8–5.6)

## 2022-07-28 LAB — CBC
Hematocrit: 36.9 % (ref 34.0–46.6)
Hemoglobin: 11.7 g/dL (ref 11.1–15.9)
MCH: 26.2 pg — ABNORMAL LOW (ref 26.6–33.0)
MCHC: 31.7 g/dL (ref 31.5–35.7)
MCV: 83 fL (ref 79–97)
Platelets: 387 10*3/uL (ref 150–450)
RBC: 4.47 x10E6/uL (ref 3.77–5.28)
RDW: 14.8 % (ref 11.7–15.4)
WBC: 8.1 10*3/uL (ref 3.4–10.8)

## 2022-07-28 MED ORDER — DOXYCYCLINE HYCLATE 100 MG PO CAPS: 100.0000 mg | ORAL_CAPSULE | Freq: Two times a day (BID) | ORAL | 1 refills | Status: AC

## 2022-07-28 MED ORDER — METRONIDAZOLE 0.75 % VA GEL: 1.0000 | Freq: Every day | VAGINAL | 1 refills | Status: DC

## 2022-07-29 ENCOUNTER — Telehealth: Payer: Self-pay | Admitting: General Practice

## 2022-07-29 ENCOUNTER — Telehealth: Payer: Self-pay

## 2022-07-29 NOTE — Telephone Encounter (Signed)
Called Pt back regarding positive Chlamydia & BV results. Pt advised dids see message thru My Chart & picked up Meds. Pt wanted to know if she should notify last person that she was with or some from 3 months ago, I advised the last person & no contact until 10 days after last person treated.Pt verbalized understanding.

## 2022-07-29 NOTE — Telephone Encounter (Signed)
Called Pt to go over positive test results for BV & Chlamydia & to explain Rx was sent to pharmacy, no answer, left VM for call back.

## 2022-07-29 NOTE — Telephone Encounter (Signed)
Patient called and left message requesting a call back to discuss her results.

## 2022-08-02 ENCOUNTER — Encounter: Payer: Self-pay | Admitting: Obstetrics and Gynecology

## 2022-08-02 ENCOUNTER — Ambulatory Visit: Payer: Medicaid Other

## 2022-08-02 NOTE — Telephone Encounter (Signed)
I called pt and discussed her concern.  She stated that she began having another episode of bleeding after starting to take the medications prescribed last week and wants to know if the bleeding could have been caused by the medications. I advised pt that these medications would not have any effect on her bleeding. Pt voiced understanding.

## 2022-08-03 ENCOUNTER — Ambulatory Visit: Payer: Medicaid Other

## 2022-08-04 ENCOUNTER — Ambulatory Visit
Admission: RE | Admit: 2022-08-04 | Discharge: 2022-08-04 | Disposition: A | Discharge status: Home / Self Care | Payer: Medicaid Other | Source: Ambulatory Visit | Attending: Obstetrics and Gynecology | Admitting: Obstetrics and Gynecology

## 2022-08-04 DIAGNOSIS — N939 Abnormal uterine and vaginal bleeding, unspecified: Secondary | ICD-10-CM

## 2022-08-11 ENCOUNTER — Encounter: Payer: Self-pay | Admitting: *Deleted

## 2022-08-16 ENCOUNTER — Encounter: Payer: Self-pay | Admitting: Obstetrics and Gynecology

## 2022-08-17 ENCOUNTER — Telehealth: Payer: Self-pay | Admitting: Family Medicine

## 2022-08-17 NOTE — Telephone Encounter (Signed)
Patient called in wanting to know about the medication that she was told she can take to stop her bleeding. She said if she needs an appointment for that she would like to do that asap or at least get a call from a nurse to go over the medication.

## 2022-08-18 NOTE — Telephone Encounter (Signed)
Spoke with Currie Paris, MD who states patient may be given prescription for Megace 40 mg daily until follow up appt.   Called pt who states she is not bleeding but her period should start soon. I explained that we are able to send medication if she is bleeding heavily or prolonged once her menstrual period begins. Pt verbalizes understanding and will contact office if needed prior to appt on 09/02/22.

## 2022-09-02 ENCOUNTER — Other Ambulatory Visit (HOSPITAL_COMMUNITY)
Admission: RE | Admit: 2022-09-02 | Discharge: 2022-09-02 | Disposition: A | Discharge status: Home / Self Care | Payer: Medicaid Other | Source: Ambulatory Visit | Attending: Obstetrics and Gynecology | Admitting: Obstetrics and Gynecology

## 2022-09-02 ENCOUNTER — Ambulatory Visit (INDEPENDENT_AMBULATORY_CARE_PROVIDER_SITE_OTHER): Payer: Medicaid Other | Admitting: Obstetrics and Gynecology

## 2022-09-02 VITALS — BP 114/75 | HR 84

## 2022-09-02 DIAGNOSIS — N939 Abnormal uterine and vaginal bleeding, unspecified: Secondary | ICD-10-CM | POA: Diagnosis not present

## 2022-09-02 DIAGNOSIS — Z202 Contact with and (suspected) exposure to infections with a predominantly sexual mode of transmission: Secondary | ICD-10-CM | POA: Diagnosis not present

## 2022-09-02 DIAGNOSIS — Z1231 Encounter for screening mammogram for malignant neoplasm of breast: Secondary | ICD-10-CM

## 2022-09-02 LAB — POCT PREGNANCY, URINE: Preg Test, Ur: NEGATIVE

## 2022-09-02 NOTE — Progress Notes (Signed)
GYNECOLOGY VISIT  Patient name: Patricia Wise MRN [id number]  Date of birth: 17-Oct-1980 Chief Complaint:   Procedure  History:  Patricia Wise is a 41 y.o. [id number] being seen today for EMB. Dx with chlamydia and BV at prior visit and completed treatment. Has been having vaginal discharge as well. Has not had recurrence of heavy bleeding as of yet but ok to proceed with EMB today.  Would also like to have mammo ordered now that she is 41.   Past Medical History:  Diagnosis Date   Fatty liver 05/2017   noted along with gallstone in neck of GB on CT 08/15/17, LFTs normal at that time.    Morbid obesity ([place]) 2014   s/p gastric bypass in [place].     mrsa    Normocytic anemia 02/2017   Prediabetes    Vaginal Pap smear, abnormal     Past Surgical History:  Procedure Laterality Date   BIOPSY  07/12/2019   Procedure: BIOPSY;  Surgeon: Jackquline Denmark, MD;  Location: [facility];  Service: Endoscopy;;   BREAST SURGERY     augmentation   CESAREAN SECTION     CESAREAN SECTION N/A 08/14/2015    REPEAT CESAREAN SECTION;  Surgeon: Donnamae Jude, MD;    DILATION AND CURETTAGE OF UTERUS     DILATION AND EVACUATION N/A 02/18/2017   for missed Ab.    ESOPHAGOGASTRODUODENOSCOPY (EGD) WITH PROPOFOL N/A 08/16/2017   Procedure: ESOPHAGOGASTRODUODENOSCOPY (EGD) WITH PROPOFOL;  Surgeon: Irene Shipper, MD;  [facility];  Service: Endoscopy;  Laterality: N/A;   ESOPHAGOGASTRODUODENOSCOPY (EGD) WITH PROPOFOL N/A 07/12/2019   Procedure: ESOPHAGOGASTRODUODENOSCOPY (EGD) WITH PROPOFOL;  Surgeon: Jackquline Denmark, MD;  [facility];  Service: Endoscopy;  Laterality: N/A;   GASTRIC BYPASS  2013 vs 2014   in [place] Bilateral    carpal tunnel    The following portions of the patient's history were reviewed and updated as appropriate: allergies, current medications, past family history, past medical history, past social history, past surgical  history and problem list.    Review of Systems:  Pertinent items are noted in HPI. Comprehensive review of systems was otherwise negative.   Objective:  Physical Exam BP 114/75   Pulse 84    Physical Exam Vitals and nursing note reviewed. Exam conducted with a chaperone present.  Constitutional:      Appearance: Normal appearance.  HENT:     Head: Normocephalic and atraumatic.  Cardiovascular:     Rate and Rhythm: Normal rate and regular rhythm.  Pulmonary:     Effort: Pulmonary effort is normal.     Breath sounds: Normal breath sounds.  Genitourinary:    General: Normal vulva.     Vagina: Normal.     Cervix: Normal.  Skin:    General: Skin is warm and dry.  Neurological:     General: No focal deficit present.     Mental Status: She is alert.  Psychiatric:        Mood and Affect: Mood normal.        Behavior: Behavior normal.        Thought Content: Thought content normal.        Judgment: Judgment normal.    Endometrial Biopsy Procedure  Patient identified, informed consent performed,  indication reviewed, consent signed.  Reviewed risk of perforation, pain, bleeding, insufficient sample, etc were reviewd. Time out was performed.  Urine pregnancy test negative.  Speculum placed in the vagina.  Cervix visualized.  Cleaned with Betadine x 2.  Anterior cervix infiltrated with 0.5cc of 1% lidocaine. Grasped anteriorly with a single tooth tenaculum.  Paracervical block was administered and the endocervical canal instilled with lidocaine. Endometrial pipelle was used to draw up 1cc of 1% lidocaine, introduced into the cervical os but not able to enter the cavity. The internal os was dilated with disposable dilators. The endometrial pipelle was then able to be introduced and instill the endometrial cavity and sounded to 10cm.  The pipelle was passed twice without difficulty and sample obtained. Tenaculum was removed, good hemostasis noted.  Patient tolerated procedure well.   Patient was given post-procedure instructions.    Labs and Imaging US PELVIC COMPLETE WITH TRANSVAGINAL  Result Date: 08/06/2022 CLINICAL DATA:  Initial evaluation for abnormal uterine bleeding. EXAM: TRANSABDOMINAL AND TRANSVAGINAL ULTRASOUND OF PELVIS TECHNIQUE: Both transabdominal and transvaginal ultrasound examinations of the pelvis were performed. Transabdominal technique was performed for global imaging of the pelvis including uterus, ovaries, adnexal regions, and pelvic cul-de-sac. It was necessary to proceed with endovaginal exam following the transabdominal exam to visualize the endometrium. COMPARISON:  Ultrasound from 07/02/2019. FINDINGS: Uterus Measurements: 10.5 x 4.0 x 5.8 cm = volume: 127.5 mL. Uterus is anteverted. Heterogeneous echotexture seen within the uterine myometrium without discrete fibroid. Endometrium Thickness: 4 mm.  No focal abnormality visualized. Right ovary Measurements: 2.5 x 1.2 x 1.6 cm = volume: 2.6 mL. Normal appearance/no adnexal mass. Left ovary Measurements: 2.6 x 1.5 x 1.5 cm = volume: 3.1 mL. Normal appearance/no adnexal mass. Other findings No abnormal free fluid. IMPRESSION: 1. Normal pelvic ultrasound. 2. Endometrial stripe measures 4 mm in thickness. If bleeding remains unresponsive to hormonal or medical therapy, sonohysterogram should be considered for focal lesion work-up. (Ref: Radiological Reasoning: Algorithmic Workup of Abnormal Vaginal Bleeding with Endovaginal Sonography and Sonohysterography. AJR 2008; [id number]:[id number]). Electronically Signed   By: Jeannine Boga M.D.   On: 08/06/2022 05:43       Assessment & Plan:   1. Abnormal uterine bleeding (AUB) Now s/p EMB, will follow up path results. Patient interested in definitive management of bleeding and would like to proceed with hysterectomy. Does not want ablation due to risk of failure. She is sure she has completed childbearing. - Surgical pathology  2. Encounter for assessment of STD  exposure Repeat swab today per request - Cervicovaginal ancillary only( [place])  3. Encounter for screening mammogram for malignant neoplasm of breast Mammogram ordered  - MM 3D SCREEN BREAST BILATERAL; Future  Patient desires surgical management with RA-TLH, BS, cysto.  The risks of surgery were discussed in detail with the patient including but not limited to: bleeding which may require transfusion or reoperation; infection which may require prolonged hospitalization or re-hospitalization and antibiotic therapy; injury to bowel, bladder, ureters and major vessels or other surrounding organs which may lead to other procedures; formation of adhesions; need for additional procedures including laparotomy or subsequent procedures secondary to intraoperative injury or abnormal pathology; thromboembolic phenomenon; incisional problems and other postoperative or anesthesia complications.  Patient was told that the likelihood that her condition and symptoms will be treated effectively with this surgical management was very high; the postoperative expectations were also discussed in detail. The patient also understands the alternative treatment options which were discussed in full. All questions were answered.  She was told that she will be contacted by our surgical scheduler regarding the time and date of her surgery; routine preoperative instructions will  be given to her by the preoperative nursing team.  Printed patient education handouts about the procedure were given to the patient to review at home.    Routine preventative health maintenance measures emphasized.  Darliss Cheney, MD [facility] [place]

## 2022-09-03 LAB — CERVICOVAGINAL ANCILLARY ONLY
Bacterial Vaginitis (gardnerella): POSITIVE — AB
Candida Glabrata: NEGATIVE
Candida Vaginitis: NEGATIVE
Chlamydia: NEGATIVE
Comment: NEGATIVE
Comment: NEGATIVE
Comment: NEGATIVE
Comment: NEGATIVE
Comment: NEGATIVE
Comment: NORMAL
Neisseria Gonorrhea: NEGATIVE
Trichomonas: NEGATIVE

## 2022-09-06 LAB — SURGICAL PATHOLOGY

## 2022-09-07 ENCOUNTER — Other Ambulatory Visit: Payer: Self-pay | Admitting: Obstetrics and Gynecology

## 2022-09-07 DIAGNOSIS — B9689 Other specified bacterial agents as the cause of diseases classified elsewhere: Secondary | ICD-10-CM

## 2022-09-07 MED ORDER — METRONIDAZOLE 500 MG PO TABS: 500.0000 mg | ORAL_TABLET | Freq: Two times a day (BID) | ORAL | 0 refills | Status: AC

## 2022-09-09 ENCOUNTER — Ambulatory Visit
Admission: RE | Admit: 2022-09-09 | Discharge: 2022-09-09 | Disposition: A | Discharge status: Home / Self Care | Payer: Medicaid Other | Source: Ambulatory Visit | Attending: Obstetrics and Gynecology | Admitting: Obstetrics and Gynecology

## 2022-09-09 ENCOUNTER — Other Ambulatory Visit: Payer: Self-pay | Admitting: Obstetrics and Gynecology

## 2022-09-09 DIAGNOSIS — Z202 Contact with and (suspected) exposure to infections with a predominantly sexual mode of transmission: Secondary | ICD-10-CM

## 2022-09-09 DIAGNOSIS — N939 Abnormal uterine and vaginal bleeding, unspecified: Secondary | ICD-10-CM

## 2022-09-09 DIAGNOSIS — Z1231 Encounter for screening mammogram for malignant neoplasm of breast: Secondary | ICD-10-CM

## 2022-09-13 ENCOUNTER — Encounter: Payer: Self-pay | Admitting: *Deleted

## 2022-09-16 ENCOUNTER — Ambulatory Visit: Payer: Medicaid Other | Admitting: Obstetrics and Gynecology

## 2022-09-22 ENCOUNTER — Other Ambulatory Visit: Payer: Self-pay

## 2022-09-22 ENCOUNTER — Encounter (HOSPITAL_BASED_OUTPATIENT_CLINIC_OR_DEPARTMENT_OTHER): Payer: Self-pay | Admitting: Obstetrics and Gynecology

## 2022-09-22 DIAGNOSIS — Z01812 Encounter for preprocedural laboratory examination: Secondary | ICD-10-CM | POA: Diagnosis not present

## 2022-09-22 NOTE — Progress Notes (Signed)
Spoke w/ via phone for pre-op interview---[facility] Lab needs dos---- urine pregnancy per anesthesia, surgeon orders pending as of 09/22/22              Lab results------09/30/22 lab appt for cbc, type & screen COVID test -----patient states asymptomatic no test needed Arrive at -------1100 on Tuesday, 10/05/22 NPO after MN NO Solid Food.  Clear liquids from MN until---1000 Med rec completed Medications to take morning of surgery -----none Diabetic medication -----n/a Patient instructed no nail polish to be worn day of surgery Patient instructed to bring photo id and insurance card day of surgery  Patient aware to have Driver (ride ) / caregiver    for 24 hours after surgery - [facility] Patient Special Instructions -----Extended stay instructions given. Pre-Op special Istructions -----Requested orders from Dr. Currie Paris via Epic IB on 09/22/22. Patient verbalized understanding of instructions that were given at this phone interview. Patient denies shortness of breath, chest pain, fever, cough at this phone interview.

## 2022-09-22 NOTE — Progress Notes (Signed)
Your procedure is scheduled on Tuesday, 10/05/2022.  Report to [facility]   Call this number if you have problems the morning of surgery  [phone].   OUR ADDRESS IS [address].  WE ARE LOCATED IN [facility].  PLEASE BRING YOUR INSURANCE CARD AND PHOTO ID DAY OF SURGERY.  ONLY 2 PEOPLE ARE ALLOWED IN  WAITING  ROOM.                                      REMEMBER:  DO NOT EAT FOOD, CANDY GUM OR MINTS  AFTER MIDNIGHT THE NIGHT BEFORE YOUR SURGERY . YOU MAY HAVE CLEAR LIQUIDS FROM MIDNIGHT THE NIGHT BEFORE YOUR SURGERY UNTIL  10:00 AM. NO CLEAR LIQUIDS AFTER   10:00 AM DAY OF SURGERY.  YOU MAY  BRUSH YOUR TEETH MORNING OF SURGERY AND RINSE YOUR MOUTH OUT, NO CHEWING GUM CANDY OR MINTS.     CLEAR LIQUID DIET   Foods Allowed                                                                     Foods Excluded  Coffee and tea, regular and decaf                             liquids that you cannot  Plain Jell-O                                                                   see through such as: Fruit ices (not with fruit pulp)                                     milk, soups, orange juice  Plain  Popsicles                                    All solid food Carbonated beverages, regular and diet                                    Cranberry, grape and apple juices Sports drinks like Gatorade _____________________________________________________________________     TAKE ONLY THESE MEDICATIONS MORNING OF SURGERY: NONE    UP TO 4 VISITORS  MAY VISIT IN THE EXTENDED RECOVERY ROOM UNTIL 800 PM ONLY.  ONE  VISITOR AGE 70 AND OVER MAY SPEND THE NIGHT AND MUST BE IN EXTENDED RECOVERY ROOM NO LATER THAN 800 PM . YOUR DISCHARGE TIME AFTER YOU SPEND THE NIGHT IS 900 AM THE MORNING AFTER YOUR SURGERY.  YOU MAY PACK A SMALL OVERNIGHT BAG WITH TOILETRIES FOR YOUR OVERNIGHT STAY  IF YOU WISH.  YOUR PRESCRIPTION MEDICATIONS WILL BE  PROVIDED DURING [facility].                                      DO NOT WEAR JEWERLY, MAKE UP. DO NOT WEAR LOTIONS, POWDERS, PERFUMES OR NAIL POLISH ON YOUR FINGERNAILS. TOENAIL POLISH IS OK TO WEAR. DO NOT SHAVE FOR 48 HOURS PRIOR TO DAY OF SURGERY. MEN MAY SHAVE FACE AND NECK. CONTACTS, GLASSES, OR DENTURES MAY NOT BE WORN TO SURGERY.  REMEMBER: NO SMOKING, DRUGS OR ALCOHOL FOR 24 HOURS BEFORE YOUR SURGERY.                                    [facility] IS NOT RESPONSIBLE  FOR ANY BELONGINGS.                                                                    Marland Kitchen           [facility] - Preparing for Surgery Before surgery, you can play an important role.  Because skin is not sterile, your skin needs to be as free of germs as possible.  You can reduce the number of germs on your skin by washing with CHG (chlorahexidine gluconate) soap before surgery.  CHG is an antiseptic cleaner which kills germs and bonds with the skin to continue killing germs even after washing. Please DO NOT use if you have an allergy to CHG or antibacterial soaps.  If your skin becomes reddened/irritated stop using the CHG and inform your nurse when you arrive at Short Stay. Do not shave (including legs and underarms) for at least 48 hours prior to the first CHG shower.  You may shave your face/neck. Please follow these instructions carefully:  1.  Shower with CHG Soap the night before surgery and the  morning of Surgery.  2.  If you choose to wash your hair, wash your hair first as usual with your  normal  shampoo.  3.  After you shampoo, rinse your hair and body thoroughly to remove the  shampoo.                                        4.  Use CHG as you would any other liquid soap.  You can apply chg directly  to the skin and wash , chg soap provided, night before and morning of your surgery.  5.  Apply the CHG Soap to your body ONLY FROM THE NECK DOWN.   Do not use on face/ open                           Wound  or open sores. Avoid contact with eyes, ears mouth and genitals (private parts).                       Wash face,  Genitals (private parts) with your normal soap.  6.  Wash thoroughly, paying special attention to the area where your surgery  will be performed.  7.  Thoroughly rinse your body with warm water from the neck down.  8.  DO NOT shower/wash with your normal soap after using and rinsing off  the CHG Soap.             9.  Pat yourself dry with a clean towel.            10.  Wear clean pajamas.            11.  Place clean sheets on your bed the night of your first shower and do not  sleep with pets. Day of Surgery : Do not apply any lotions/deodorants the morning of surgery.  Please wear clean clothes to the hospital/surgery center.  IF YOU HAVE ANY SKIN IRRITATION OR PROBLEMS WITH THE SURGICAL SOAP, PLEASE GET A BAR OF GOLD DIAL SOAP AND SHOWER THE NIGHT BEFORE YOUR SURGERY AND THE MORNING OF YOUR SURGERY. PLEASE LET THE NURSE KNOW MORNING OF YOUR SURGERY IF YOU HAD ANY PROBLEMS WITH THE SURGICAL SOAP.   ________________________________________________________________________                                                        QUESTIONS [facility] PRE OP NURSE PHONE [phone].

## 2022-09-30 ENCOUNTER — Telehealth: Payer: Self-pay

## 2022-09-30 ENCOUNTER — Encounter (HOSPITAL_COMMUNITY)
Admission: RE | Admit: 2022-09-30 | Discharge: 2022-09-30 | Disposition: A | Discharge status: Home / Self Care | Payer: Medicaid Other | Source: Ambulatory Visit | Attending: Obstetrics and Gynecology | Admitting: Obstetrics and Gynecology

## 2022-09-30 DIAGNOSIS — Z01818 Encounter for other preprocedural examination: Secondary | ICD-10-CM

## 2022-09-30 DIAGNOSIS — Z01812 Encounter for preprocedural laboratory examination: Secondary | ICD-10-CM | POA: Diagnosis not present

## 2022-09-30 LAB — CBC
HCT: 43.4 % (ref 36.0–46.0)
Hemoglobin: 13.3 g/dL (ref 12.0–15.0)
MCH: 27 pg (ref 26.0–34.0)
MCHC: 30.6 g/dL (ref 30.0–36.0)
MCV: 88.2 fL (ref 80.0–100.0)
Platelets: 299 10*3/uL (ref 150–400)
RBC: 4.92 MIL/uL (ref 3.87–5.11)
RDW: 16.2 % — ABNORMAL HIGH (ref 11.5–15.5)
WBC: 7.5 10*3/uL (ref 4.0–10.5)
nRBC: 0 % (ref 0.0–0.2)

## 2022-09-30 NOTE — Telephone Encounter (Signed)
Call placed to pt. Spoke with pt. Pt given update on coming today to sign hysterectomy statement.pt verbalized understanding and will come now to the office to sign.  Colletta [place], RNC

## 2022-09-30 NOTE — Telephone Encounter (Signed)
-----   Message from Francia Greaves sent at 09/30/2022 11:18 AM EST ----- Regarding: NEEDS HYSTERECTOMY STATEMENT TODAY Need a hysterectomy statement today or her surgery will be cancelled.

## 2022-10-05 ENCOUNTER — Ambulatory Visit (HOSPITAL_BASED_OUTPATIENT_CLINIC_OR_DEPARTMENT_OTHER): Payer: Medicaid Other | Admitting: Anesthesiology

## 2022-10-05 ENCOUNTER — Encounter (HOSPITAL_BASED_OUTPATIENT_CLINIC_OR_DEPARTMENT_OTHER): Payer: Self-pay | Admitting: Obstetrics and Gynecology

## 2022-10-05 ENCOUNTER — Encounter (HOSPITAL_BASED_OUTPATIENT_CLINIC_OR_DEPARTMENT_OTHER)
Admission: RE | Disposition: A | Discharge status: Home / Self Care | Payer: Self-pay | Source: Home / Self Care | Attending: Obstetrics and Gynecology

## 2022-10-05 ENCOUNTER — Ambulatory Visit (HOSPITAL_BASED_OUTPATIENT_CLINIC_OR_DEPARTMENT_OTHER)
Admission: RE | Admit: 2022-10-05 | Discharge: 2022-10-06 | Disposition: A | Discharge status: Home / Self Care | Payer: Medicaid Other | Attending: Obstetrics and Gynecology | Admitting: Obstetrics and Gynecology

## 2022-10-05 ENCOUNTER — Other Ambulatory Visit: Payer: Self-pay

## 2022-10-05 DIAGNOSIS — R7303 Prediabetes: Secondary | ICD-10-CM | POA: Insufficient documentation

## 2022-10-05 DIAGNOSIS — Z6839 Body mass index (BMI) 39.0-39.9, adult: Secondary | ICD-10-CM | POA: Insufficient documentation

## 2022-10-05 DIAGNOSIS — Z9884 Bariatric surgery status: Secondary | ICD-10-CM | POA: Diagnosis not present

## 2022-10-05 DIAGNOSIS — Z8711 Personal history of peptic ulcer disease: Secondary | ICD-10-CM | POA: Insufficient documentation

## 2022-10-05 DIAGNOSIS — D259 Leiomyoma of uterus, unspecified: Secondary | ICD-10-CM | POA: Insufficient documentation

## 2022-10-05 DIAGNOSIS — Z01818 Encounter for other preprocedural examination: Secondary | ICD-10-CM

## 2022-10-05 DIAGNOSIS — Z87891 Personal history of nicotine dependence: Secondary | ICD-10-CM | POA: Diagnosis not present

## 2022-10-05 DIAGNOSIS — N72 Inflammatory disease of cervix uteri: Secondary | ICD-10-CM | POA: Insufficient documentation

## 2022-10-05 DIAGNOSIS — N939 Abnormal uterine and vaginal bleeding, unspecified: Secondary | ICD-10-CM

## 2022-10-05 HISTORY — DX: Gastrojejunal ulcer, unspecified as acute or chronic, without hemorrhage or perforation: K28.9

## 2022-10-05 HISTORY — DX: Prediabetes: R73.03

## 2022-10-05 HISTORY — PX: ROBOTIC ASSISTED TOTAL HYSTERECTOMY: SHX6085

## 2022-10-05 HISTORY — DX: Other specified complication of other internal prosthetic devices, implants and grafts, initial encounter: T85.898A

## 2022-10-05 HISTORY — PX: CYSTOSCOPY: SHX5120

## 2022-10-05 LAB — POCT PREGNANCY, URINE: Preg Test, Ur: NEGATIVE

## 2022-10-05 LAB — TYPE AND SCREEN
ABO/RH(D): O POS
Antibody Screen: NEGATIVE

## 2022-10-05 SURGERY — HYSTERECTOMY, TOTAL, ROBOT-ASSISTED
Anesthesia: General | Site: Pelvis

## 2022-10-05 MED ORDER — ONDANSETRON HCL 4 MG/2ML IJ SOLN: 4.0000 mg | Freq: Four times a day (QID) | INTRAMUSCULAR | Status: DC | PRN

## 2022-10-05 MED ORDER — LIDOCAINE 2% (20 MG/ML) 5 ML SYRINGE
INTRAMUSCULAR | Status: DC | PRN
  Administered 2022-10-05: 50 mg via INTRAVENOUS

## 2022-10-05 MED ORDER — 0.9 % SODIUM CHLORIDE (POUR BTL) OPTIME
TOPICAL | Status: DC | PRN
  Administered 2022-10-05: 500 mL

## 2022-10-05 MED ORDER — ACETAMINOPHEN 500 MG PO TABS
ORAL_TABLET | ORAL | Status: AC
  Filled 2022-10-05: qty 2

## 2022-10-05 MED ORDER — ONDANSETRON HCL 4 MG PO TABS: 4.0000 mg | ORAL_TABLET | Freq: Four times a day (QID) | ORAL | Status: DC | PRN

## 2022-10-05 MED ORDER — ROCURONIUM BROMIDE 10 MG/ML (PF) SYRINGE
PREFILLED_SYRINGE | INTRAVENOUS | Status: AC
  Filled 2022-10-05: qty 40

## 2022-10-05 MED ORDER — FENTANYL CITRATE (PF) 100 MCG/2ML IJ SOLN
25.0000 ug | INTRAMUSCULAR | Status: DC | PRN
  Administered 2022-10-05: 50 ug via INTRAVENOUS
  Administered 2022-10-05 (×2): 25 ug via INTRAVENOUS

## 2022-10-05 MED ORDER — LACTATED RINGERS IV SOLN: INTRAVENOUS | Status: DC

## 2022-10-05 MED ORDER — DEXMEDETOMIDINE HCL IN NACL 80 MCG/20ML IV SOLN
INTRAVENOUS | Status: DC | PRN
  Administered 2022-10-05 (×4): 4 ug via BUCCAL

## 2022-10-05 MED ORDER — GABAPENTIN 300 MG PO CAPS: 300.0000 mg | ORAL_CAPSULE | Freq: Two times a day (BID) | ORAL | 0 refills | Status: DC

## 2022-10-05 MED ORDER — SIMETHICONE 80 MG PO CHEW: 80.0000 mg | CHEWABLE_TABLET | Freq: Four times a day (QID) | ORAL | 0 refills | Status: AC | PRN

## 2022-10-05 MED ORDER — PROPOFOL 500 MG/50ML IV EMUL
INTRAVENOUS | Status: DC | PRN
  Administered 2022-10-05: 30 ug/kg/min via INTRAVENOUS

## 2022-10-05 MED ORDER — POLYETHYLENE GLYCOL 3350 17 G PO PACK: 17.0000 g | PACK | Freq: Every day | ORAL | 0 refills | Status: AC

## 2022-10-05 MED ORDER — HYDROMORPHONE HCL 1 MG/ML IJ SOLN
1.0000 mg | Freq: Once | INTRAMUSCULAR | Status: AC
  Administered 2022-10-05: 1 mg via INTRAVENOUS

## 2022-10-05 MED ORDER — CEFAZOLIN SODIUM-DEXTROSE 2-4 GM/100ML-% IV SOLN
2.0000 g | INTRAVENOUS | Status: AC
  Administered 2022-10-05: 2 g via INTRAVENOUS

## 2022-10-05 MED ORDER — SODIUM CHLORIDE 0.9 % IR SOLN
Status: DC | PRN
  Administered 2022-10-05: 1000 mL

## 2022-10-05 MED ORDER — OXYCODONE HCL 5 MG PO TABS: 5.0000 mg | ORAL_TABLET | ORAL | 0 refills | Status: AC | PRN

## 2022-10-05 MED ORDER — OXYCODONE HCL 5 MG PO TABS
ORAL_TABLET | ORAL | Status: AC
  Filled 2022-10-05: qty 2

## 2022-10-05 MED ORDER — LIDOCAINE HCL (PF) 2 % IJ SOLN
INTRAMUSCULAR | Status: AC
  Filled 2022-10-05: qty 10

## 2022-10-05 MED ORDER — BUPIVACAINE HCL (PF) 0.5 % IJ SOLN
INTRAMUSCULAR | Status: DC | PRN
  Administered 2022-10-05: 30 mL

## 2022-10-05 MED ORDER — OXYCODONE HCL 5 MG PO TABS: 5.0000 mg | ORAL_TABLET | Freq: Once | ORAL | Status: DC | PRN

## 2022-10-05 MED ORDER — SUGAMMADEX SODIUM 200 MG/2ML IV SOLN
INTRAVENOUS | Status: DC | PRN
  Administered 2022-10-05: 250 mg via INTRAVENOUS

## 2022-10-05 MED ORDER — PROPOFOL 10 MG/ML IV BOLUS
INTRAVENOUS | Status: DC | PRN
  Administered 2022-10-05: 260 mg via INTRAVENOUS

## 2022-10-05 MED ORDER — HYDROMORPHONE HCL 1 MG/ML IJ SOLN
INTRAMUSCULAR | Status: AC
  Filled 2022-10-05: qty 1

## 2022-10-05 MED ORDER — FENTANYL CITRATE (PF) 250 MCG/5ML IJ SOLN
INTRAMUSCULAR | Status: AC
  Filled 2022-10-05: qty 5

## 2022-10-05 MED ORDER — POVIDONE-IODINE 10 % EX SWAB
2.0000 | Freq: Once | CUTANEOUS | Status: AC
  Administered 2022-10-05: 2 via TOPICAL

## 2022-10-05 MED ORDER — POLYETHYLENE GLYCOL 3350 17 G PO PACK: 17.0000 g | PACK | Freq: Every day | ORAL | Status: DC | PRN

## 2022-10-05 MED ORDER — DEXAMETHASONE SODIUM PHOSPHATE 10 MG/ML IJ SOLN
INTRAMUSCULAR | Status: DC | PRN
  Administered 2022-10-05: 10 mg via INTRAVENOUS

## 2022-10-05 MED ORDER — OXYCODONE HCL 5 MG/5ML PO SOLN: 5.0000 mg | Freq: Once | ORAL | Status: DC | PRN

## 2022-10-05 MED ORDER — ACETAMINOPHEN 500 MG PO TABS: 1000.0000 mg | ORAL_TABLET | Freq: Four times a day (QID) | ORAL | 2 refills | Status: AC

## 2022-10-05 MED ORDER — FENTANYL CITRATE (PF) 250 MCG/5ML IJ SOLN
INTRAMUSCULAR | Status: DC | PRN
  Administered 2022-10-05: 50 ug via INTRAVENOUS
  Administered 2022-10-05: 25 ug via INTRAVENOUS
  Administered 2022-10-05 (×2): 50 ug via INTRAVENOUS
  Administered 2022-10-05: 25 ug via INTRAVENOUS

## 2022-10-05 MED ORDER — MIDAZOLAM HCL 2 MG/2ML IJ SOLN
INTRAMUSCULAR | Status: AC
  Filled 2022-10-05: qty 2

## 2022-10-05 MED ORDER — LACTATED RINGERS IV SOLN
INTRAVENOUS | Status: DC
  Administered 2022-10-05: 1000 mL via INTRAVENOUS

## 2022-10-05 MED ORDER — DIPHENHYDRAMINE HCL 50 MG/ML IJ SOLN
INTRAMUSCULAR | Status: DC | PRN
  Administered 2022-10-05: 12.5 mg via INTRAVENOUS

## 2022-10-05 MED ORDER — OXYCODONE HCL 5 MG PO TABS
5.0000 mg | ORAL_TABLET | ORAL | Status: DC | PRN
  Administered 2022-10-05 – 2022-10-06 (×4): 10 mg via ORAL

## 2022-10-05 MED ORDER — ACETAMINOPHEN 500 MG PO TABS
1000.0000 mg | ORAL_TABLET | Freq: Four times a day (QID) | ORAL | Status: DC
  Administered 2022-10-05 – 2022-10-06 (×3): 1000 mg via ORAL

## 2022-10-05 MED ORDER — GABAPENTIN 300 MG PO CAPS
ORAL_CAPSULE | ORAL | Status: AC
  Filled 2022-10-05: qty 1

## 2022-10-05 MED ORDER — ONDANSETRON HCL 4 MG/2ML IJ SOLN
INTRAMUSCULAR | Status: AC
  Filled 2022-10-05: qty 6

## 2022-10-05 MED ORDER — KETOROLAC TROMETHAMINE 15 MG/ML IJ SOLN: 30.0000 mg | Freq: Once | INTRAMUSCULAR | Status: DC

## 2022-10-05 MED ORDER — CEFAZOLIN SODIUM-DEXTROSE 2-4 GM/100ML-% IV SOLN
INTRAVENOUS | Status: AC
  Filled 2022-10-05: qty 100

## 2022-10-05 MED ORDER — GABAPENTIN 300 MG PO CAPS
300.0000 mg | ORAL_CAPSULE | Freq: Three times a day (TID) | ORAL | Status: DC
  Administered 2022-10-05 – 2022-10-06 (×3): 300 mg via ORAL

## 2022-10-05 MED ORDER — ROCURONIUM BROMIDE 10 MG/ML (PF) SYRINGE
PREFILLED_SYRINGE | INTRAVENOUS | Status: DC | PRN
  Administered 2022-10-05: 20 mg via INTRAVENOUS
  Administered 2022-10-05: 60 mg via INTRAVENOUS
  Administered 2022-10-05 (×3): 20 mg via INTRAVENOUS

## 2022-10-05 MED ORDER — PROMETHAZINE HCL 25 MG/ML IJ SOLN: 6.2500 mg | INTRAMUSCULAR | Status: DC | PRN

## 2022-10-05 MED ORDER — ACETAMINOPHEN 500 MG PO TABS
1000.0000 mg | ORAL_TABLET | Freq: Once | ORAL | Status: AC
  Administered 2022-10-05: 1000 mg via ORAL

## 2022-10-05 MED ORDER — DEXAMETHASONE SODIUM PHOSPHATE 10 MG/ML IJ SOLN
INTRAMUSCULAR | Status: AC
  Filled 2022-10-05: qty 3

## 2022-10-05 MED ORDER — KETOROLAC TROMETHAMINE 15 MG/ML IJ SOLN
30.0000 mg | Freq: Once | INTRAMUSCULAR | Status: AC
  Administered 2022-10-05: 30 mg via INTRAVENOUS

## 2022-10-05 MED ORDER — FENTANYL CITRATE (PF) 100 MCG/2ML IJ SOLN
INTRAMUSCULAR | Status: AC
  Filled 2022-10-05: qty 2

## 2022-10-05 MED ORDER — KETOROLAC TROMETHAMINE 30 MG/ML IJ SOLN
INTRAMUSCULAR | Status: AC
  Filled 2022-10-05: qty 1

## 2022-10-05 MED ORDER — MIDAZOLAM HCL 2 MG/2ML IJ SOLN
INTRAMUSCULAR | Status: DC | PRN
  Administered 2022-10-05: 2 mg via INTRAVENOUS

## 2022-10-05 SURGICAL SUPPLY — 77 items
ADH SKN CLS APL DERMABOND .7 (GAUZE/BANDAGES/DRESSINGS) ×2
APL SRG 38 LTWT LNG FL B (MISCELLANEOUS)
APPLICATOR ARISTA FLEXITIP XL (MISCELLANEOUS) IMPLANT
APPLIER CLIP 5 13 M/L LIGAMAX5 (MISCELLANEOUS)
APR CLP MED LRG 5 ANG JAW (MISCELLANEOUS)
BARRIER ADHS 3X4 INTERCEED (GAUZE/BANDAGES/DRESSINGS) IMPLANT
BRR ADH 4X3 ABS CNTRL BYND (GAUZE/BANDAGES/DRESSINGS)
CATH FOLEY 3WAY  5CC 16FR (CATHETERS) ×2
CATH FOLEY 3WAY 5CC 16FR (CATHETERS) ×2 IMPLANT
CLIP APPLIE 5 13 M/L LIGAMAX5 (MISCELLANEOUS) IMPLANT
COVER BACK TABLE 60X90IN (DRAPES) ×2 IMPLANT
COVER TIP SHEARS 8 DVNC (MISCELLANEOUS) ×2 IMPLANT
COVER TIP SHEARS 8MM DA VINCI (MISCELLANEOUS) ×2
DEFOGGER SCOPE WARMER CLEARIFY (MISCELLANEOUS) ×2 IMPLANT
DERMABOND ADVANCED .7 DNX12 (GAUZE/BANDAGES/DRESSINGS) ×2 IMPLANT
DRAPE ARM DVNC X/XI (DISPOSABLE) ×6 IMPLANT
DRAPE COLUMN DVNC XI (DISPOSABLE) ×2 IMPLANT
DRAPE DA VINCI XI ARM (DISPOSABLE) ×6
DRAPE DA VINCI XI COLUMN (DISPOSABLE) ×2
DRAPE STERI URO 9X17 APER PCH (DRAPES) ×2 IMPLANT
DRAPE UTILITY XL STRL (DRAPES) ×2 IMPLANT
DURAPREP 26ML APPLICATOR (WOUND CARE) ×2 IMPLANT
ELECT REM PT RETURN 9FT ADLT (ELECTROSURGICAL) ×2
ELECTRODE REM PT RTRN 9FT ADLT (ELECTROSURGICAL) ×2 IMPLANT
GAUZE 4X4 16PLY ~~LOC~~+RFID DBL (SPONGE) ×4 IMPLANT
GLOVE BIO SURGEON STRL SZ7 (GLOVE) ×6 IMPLANT
GLOVE BIOGEL PI IND STRL 7.0 (GLOVE) ×6 IMPLANT
HARMONIC RUM II 3.5CM SILVER (DISPOSABLE)
HEMOSTAT ARISTA ABSORB 3G PWDR (HEMOSTASIS) IMPLANT
HOLDER FOLEY CATH W/STRAP (MISCELLANEOUS) IMPLANT
IRRIG SUCT STRYKERFLOW 2 WTIP (MISCELLANEOUS) ×2
IRRIGATION SUCT STRKRFLW 2 WTP (MISCELLANEOUS) ×2 IMPLANT
KIT PINK PAD W/HEAD ARE REST (MISCELLANEOUS)
KIT PINK PAD W/HEAD ARM REST (MISCELLANEOUS) IMPLANT
KIT TURNOVER CYSTO (KITS) ×2 IMPLANT
LEGGING LITHOTOMY PAIR STRL (DRAPES) ×2 IMPLANT
MANIPULATOR ADVINCU DEL 2.5 PL (MISCELLANEOUS) IMPLANT
MANIPULATOR ADVINCU DEL 3.0 PL (MISCELLANEOUS) IMPLANT
MANIPULATOR ADVINCU DEL 3.5 PL (MISCELLANEOUS) IMPLANT
MANIPULATOR ADVINCU DEL 4.0 PL (MISCELLANEOUS) IMPLANT
NDL INSUFFLATION 14GA 120MM (NEEDLE) ×2 IMPLANT
NEEDLE INSUFFLATION 14GA 120MM (NEEDLE) ×2 IMPLANT
OBTURATOR OPTICAL STANDARD 8MM (TROCAR) ×2
OBTURATOR OPTICAL STND 8 DVNC (TROCAR) ×2
OBTURATOR OPTICALSTD 8 DVNC (TROCAR) ×2 IMPLANT
OCCLUDER COLPOPNEUMO (BALLOONS) ×2 IMPLANT
PACK ROBOT WH (CUSTOM PROCEDURE TRAY) ×2 IMPLANT
PACK ROBOTIC GOWN (GOWN DISPOSABLE) ×2 IMPLANT
PAD OB MATERNITY 4.3X12.25 (PERSONAL CARE ITEMS) ×2 IMPLANT
PAD PREP 24X48 CUFFED NSTRL (MISCELLANEOUS) ×2 IMPLANT
PROTECTOR NERVE ULNAR (MISCELLANEOUS) ×4 IMPLANT
RUMI II 3.0CM BLUE KOH-EFFICIE (DISPOSABLE) IMPLANT
SCALPEL HRMNC RUM II 3.5 SILVR (DISPOSABLE) IMPLANT
SEAL CANN UNIV 5-8 DVNC XI (MISCELLANEOUS) ×6 IMPLANT
SEAL XI 5MM-8MM UNIVERSAL (MISCELLANEOUS) ×6
SET IRRIG Y TYPE TUR BLADDER L (SET/KITS/TRAYS/PACK) ×2 IMPLANT
SET TRI-LUMEN FLTR TB AIRSEAL (TUBING) ×2 IMPLANT
SPIKE FLUID TRANSFER (MISCELLANEOUS) ×2 IMPLANT
SPONGE T-LAP 4X18 ~~LOC~~+RFID (SPONGE) ×2 IMPLANT
SUT VIC AB 0 CT1 27 (SUTURE)
SUT VIC AB 0 CT1 27XBRD ANBCTR (SUTURE) ×4 IMPLANT
SUT VIC AB 4-0 PS2 18 (SUTURE) ×4 IMPLANT
SUT VICRYL 0 UR6 27IN ABS (SUTURE) IMPLANT
SUT VLOC 180 0 9IN  GS21 (SUTURE) ×2
SUT VLOC 180 0 9IN GS21 (SUTURE) ×2 IMPLANT
SYSTEM CARTER THOMASON II (TROCAR) IMPLANT
TIP RUMI ORANGE 6.7MMX12CM (TIP) ×2 IMPLANT
TIP UTERINE 5.1X6CM LAV DISP (MISCELLANEOUS) ×2 IMPLANT
TIP UTERINE 6.7X10CM GRN DISP (MISCELLANEOUS) ×2 IMPLANT
TIP UTERINE 6.7X6CM WHT DISP (MISCELLANEOUS) ×2 IMPLANT
TIP UTERINE 6.7X8CM BLUE DISP (MISCELLANEOUS) ×2 IMPLANT
TOWEL OR 17X26 10 PK STRL BLUE (TOWEL DISPOSABLE) ×2 IMPLANT
TROCAR KII 8X100ML NONTHREADED (TROCAR) IMPLANT
TROCAR PORT AIRSEAL 5X120 (TROCAR) ×2 IMPLANT
TROCAR Z-THREAD BLADED 5X100MM (TROCAR) IMPLANT
TROCAR Z-THREAD FIOS 5X100MM (TROCAR) IMPLANT
WATER STERILE IRR 1000ML POUR (IV SOLUTION) ×2 IMPLANT

## 2022-10-05 NOTE — Anesthesia Procedure Notes (Signed)
Procedure Name: Intubation Date/Time: 10/05/2022 12:57 PM  Performed by: Clearnce Sorrel, CRNAPre-anesthesia Checklist: Patient identified, Emergency Drugs available, Suction available and Patient being monitored Patient Re-evaluated:Patient Re-evaluated prior to induction Oxygen Delivery Method: Circle System Utilized Preoxygenation: Pre-oxygenation with 100% oxygen Induction Type: IV induction Ventilation: Mask ventilation without difficulty Laryngoscope Size: 3 and Mac Grade View: Grade I Tube type: Oral Tube size: 7.0 mm Number of attempts: 1 Airway Equipment and Method: Stylet and Oral airway Placement Confirmation: ETT inserted through vocal cords under direct vision, positive ETCO2 and breath sounds checked- equal and bilateral Tube secured with: Tape Dental Injury: Teeth and Oropharynx as per pre-operative assessment

## 2022-10-05 NOTE — Discharge Instructions (Signed)
Take tylenol every 6 hours for at least 3 days along with the gabapentin twice a day. Take the oxycodone as needed.    Post Anesthesia Home Care Instructions  Activity: Get plenty of rest for the remainder of the day. A responsible individual must stay with you for 24 hours following the procedure.  For the next 24 hours, DO NOT: -Drive a car -Paediatric nurse -Drink alcoholic beverages -Take any medication unless instructed by your physician -Make any legal decisions or sign important papers.  Meals: Start with liquid foods such as gelatin or soup. Progress to regular foods as tolerated. Avoid greasy, spicy, heavy foods. If nausea and/or vomiting occur, drink only clear liquids until the nausea and/or vomiting subsides. Call your physician if vomiting continues.  Special Instructions/Symptoms: Your throat may feel dry or sore from the anesthesia or the breathing tube placed in your throat during surgery. If this causes discomfort, gargle with warm salt water. The discomfort should disappear within 24 hours.

## 2022-10-05 NOTE — H&P (Signed)
Patricia Wise is an 42 y.o. female.   Pertinent Gynecological History:  Irregular bleeding Current complaints: reports heavy prolonged bleeding. Bleeding every other week for at least 6 months. Menses are typically heavy but became more frequent. No significant pain. Now having acne associated with menses.  She reports prior to the 71-month  her menses would be 7 days heavy, but not particularly painful and regularly once a month. Not sure has completed childbearing.  She does report donating plasma very frequently.  She reports that they check her blood level at the plasma donation center.  She reports increased fatigue, taking iron as needed.  Patient's last menstrual period was 09/23/2022 (exact date).    Past Medical History:  Diagnosis Date   Anastomotic ulcer S/P gastric bypass    1 ulcer noted on 2018 EGD, 2 ulcers noted on 2020 EGD   COVID-19 12/2019   Fatty liver 05/2017   noted along with gallstone in neck of GB on CT 08/15/17, LFTs normal at that time.    Morbid [place] 2014   [place]    Patient stated she does not know when she had MRSA or where it was located in her body.   Normocytic anemia 02/2017   Pre-diabetes    07/27/22 HgbA1c 5.8   Vaginal Pap smear, abnormal     Past Surgical History:  Procedure Laterality Date   BIOPSY  07/12/2019   Procedure: BIOPSY;  Surgeon: GJackquline Denmark MD;  [facility];  Service: Endoscopy;;   BREAST SURGERY  2015   augmentation   CESAREAN SECTION  2008   CESAREAN SECTION N/A 08/14/2015    REPEAT CESAREAN SECTION;  Surgeon: TDonnamae Jude MD;    DILATION AND CURETTAGE OF UTERUS     DILATION AND EVACUATION N/A 02/18/2017   for missed Ab.    ESOPHAGOGASTRODUODENOSCOPY (EGD) WITH PROPOFOL N/A 08/16/2017   Procedure: ESOPHAGOGASTRODUODENOSCOPY (EGD) WITH PROPOFOL;  Surgeon: PIrene Shipper MD;  Location: [facility] ENDOSCOPY;  Service: Endoscopy;  Laterality: N/A;   ESOPHAGOGASTRODUODENOSCOPY  (EGD) WITH PROPOFOL N/A 07/12/2019   Procedure: ESOPHAGOGASTRODUODENOSCOPY (EGD) WITH PROPOFOL;  Surgeon: GJackquline Denmark MD;  [facility]  Service: Endoscopy;  Laterality: N/A;   GASTRIC BYPASS  2013 [place]   WRIST SURGERY Bilateral    carpal tunnel    Family History  Problem Relation Age of Onset   Cancer Mother    Breast cancer Neg Hx     Social History:  reports that she quit smoking about 10 years ago. Her smoking use included cigarettes. She has a 20.00 pack-year smoking history. She has never used smokeless tobacco. She reports current alcohol use. She reports that she does not use drugs.  Allergies:  Allergies  Allergen Reactions   Latex Other (See Comments)    Patient states she cannot tolerate Latex in the genital region.    Medications Prior to Admission  Medication Sig Dispense Refill Last Dose   acetaminophen (TYLENOL) 500 MG tablet Take 1,000 mg by mouth 2 (two) times daily as needed.   Past Week   ferrous sulfate 325 (65 FE) MG tablet Take 325 mg by mouth daily with breakfast.   Past Month     Blood pressure 114/84, pulse (!) 58, temperature 97.8 F (36.6 C), temperature source Oral, resp. rate 17, height '5\' 6"'$  (1.676 m), weight 110.2 kg, last  menstrual period 09/23/2022, SpO2 100 %. Physical Exam Vitals reviewed.  Constitutional:      Appearance: Normal appearance.  Cardiovascular:     Rate and Rhythm: Normal rate.  Pulmonary:     Effort: Pulmonary effort is normal.  Neurological:     Mental Status: She is alert.     Results for orders placed or performed during the hospital encounter of 10/05/22 (from the past 24 hour(s))  Pregnancy, urine POC     Status: None   Collection Time: 10/05/22 11:03 AM  Result Value Ref Range   Preg Test, Ur NEGATIVE NEGATIVE    No results found.  Assessment/Plan: Patient desires surgical management with RA-TLH, BS, cysto.  The risks of surgery were discussed  in detail with the patient including but not limited to: bleeding which may require transfusion or reoperation; infection which may require prolonged hospitalization or re-hospitalization and antibiotic therapy; injury to bowel, bladder, ureters and major vessels or other surrounding organs which may lead to other procedures; formation of adhesions; need for additional procedures including laparotomy or subsequent procedures secondary to intraoperative injury or abnormal pathology; thromboembolic phenomenon; incisional problems and other postoperative or anesthesia complications.  Patient was told that the likelihood that her condition and symptoms will be treated effectively with this surgical management was very high; the postoperative expectations were also discussed in detail. The patient also understands the alternative treatment options which were discussed in full. All questions were answered.  She was told that she will be contacted by our surgical scheduler regarding the time and date of her surgery; routine preoperative instructions will be given to her by the preoperative nursing team.  Printed patient education handouts about the procedure were given to the patient to review at home.     Davie Sagona 10/05/2022, 12:03 PM

## 2022-10-05 NOTE — Transfer of Care (Signed)
Immediate Anesthesia Transfer of Care Note  Patient: Patricia Wise  Procedure(s) Performed: XI ROBOTIC ASSISTED TOTAL HYSTERECTOMY WITH SALPINGECTOMY (Bilateral: Pelvis) CYSTOSCOPY (Bladder)  Patient Location: PACU  Anesthesia Type:General  Level of Consciousness: drowsy and patient cooperative  Airway & Oxygen Therapy: Patient Spontanous Breathing and Patient connected to face mask oxygen  Post-op Assessment: Report given to RN and Post -op Vital signs reviewed and stable  Post vital signs: Reviewed and stable  Last Vitals:  Vitals Value Taken Time  BP 118/82 10/05/22 1537  Temp    Pulse 93 10/05/22 1542  Resp 16 10/05/22 1542  SpO2 92 % 10/05/22 1542  Vitals shown include unvalidated device data.  Last Pain:  Vitals:   10/05/22 1120  TempSrc: Oral  PainSc: 0-No pain      Patients Stated Pain Goal: 5 (12/78/71 8367)  Complications: No notable events documented.

## 2022-10-05 NOTE — Anesthesia Preprocedure Evaluation (Addendum)
Anesthesia Evaluation  Patient identified by MRN, date of birth, ID band Patient awake    Reviewed: Allergy & Precautions, NPO status , Patient's Chart, lab work & pertinent test results  History of Anesthesia Complications Negative for: history of anesthetic complications  Airway Mallampati: II  TM Distance: >3 FB Neck ROM: Full    Dental  (+) Dental Advisory Given   Pulmonary former smoker   Pulmonary exam normal        Cardiovascular negative cardio ROS Normal cardiovascular exam     Neuro/Psych negative neurological ROS  negative psych ROS   GI/Hepatic Neg liver ROS, PUD,,, S/p gastric bypass    Endo/Other   Pre-DM Obesity   Renal/GU negative Renal ROS     Musculoskeletal negative musculoskeletal ROS (+)    Abdominal   Peds  Hematology negative hematology ROS (+)   Anesthesia Other Findings   Reproductive/Obstetrics                             Anesthesia Physical Anesthesia Plan  ASA: 2  Anesthesia Plan: General   Post-op Pain Management: Tylenol PO (pre-op)* and Ketamine IV*   Induction: Intravenous  PONV Risk Score and Plan: 3 and Treatment may vary due to age or medical condition, Ondansetron, Dexamethasone, Midazolam and Scopolamine patch - Pre-op  Airway Management Planned: Oral ETT  Additional Equipment: None  Intra-op Plan:   Post-operative Plan: Extubation in OR  Informed Consent: I have reviewed the patients History and Physical, chart, labs and discussed the procedure including the risks, benefits and alternatives for the proposed anesthesia with the patient or authorized representative who has indicated his/her understanding and acceptance.     Dental advisory given  Plan Discussed with: CRNA and Anesthesiologist  Anesthesia Plan Comments:        Anesthesia Quick Evaluation

## 2022-10-05 NOTE — Op Note (Addendum)
Patricia Wise PROCEDURE DATE: 10/05/2022  PREOPERATIVE DIAGNOSIS: AUB POSTOPERATIVE DIAGNOSIS:  AUB PROCEDURE:    robotic assisted laparoscopic hysterectomy, bilateral salpingectomy, cystoscopy  SURGEON: Darliss Cheney, MD ASSIST: Lavonia Drafts, MD  An experienced assistant was required given the standard of surgical care given the complexity of the case.  This assistant was needed for exposure, dissection, suctioning, retraction, instrument exchange, and for overall help during the procedure.  INDICATIONS: 42 y.o. [id number] with fibroids.  Risks of surgery were discussed with the patient including but not limited to: bleeding which may require transfusion; infection which may require antibiotics; injury to uterus or surrounding organs; need for additional procedures including laparotomy or laparoscopy; possibility of intrauterine scarring which may impair future fertility; and other postoperative/anesthesia complications. Written informed consent was obtained.    FINDINGS:  A 10 week size uterus,  specimen sent to pathology. Normal bilateral fallopian tubes and ovaries. Bilateral ureters visualized through peritoneum. Omental adhesion to the anterior abdominal wall inferior to the umbilicus. Thickened lower uterine segment adhesion to the bladder throughout.  ANESTHESIA: General-LMA, paracervical block. INTRAVENOUS FLUIDS:  1200 ml of LR ESTIMATED BLOOD LOSS:  60cc SPECIMENS: uterus, cervix bilateral fallopian tubes  COMPLICATIONS:  None immediate.  The risks, benefits, and alternatives of surgery were explained, understood, and accepted. Consents were signed. All questions were answered. She was taken to the operating room and general anesthesia was applied without complication. She was placed in the dorsal lithotomy position and her abdomen and vagina were prepped and draped after she had been carefully positioned on the table. A bimanual exam revealed a 10 week size uterus that  was mobile. Her adnexa were not enlarged. A Foley catheter was placed and it drained clear throughout the case. A speculum was placed in the vagina and the cervix was visualized and the anterior lip grasped with a single tooth tenaculum. The cervix was measured and the uterus was sounded to 10 cm. A Rumi uterine manipulator was placed without difficulty.  Gloves were changed and attention was turned to the abdomen. A 14m incision was made in the umbilicus and an optiview robotic trocar was placed intraperitoneally. CO2 was used to insufflate the abdomen after low intraabdominal pressure confirmed and the abdomen insufflated to approximately 4 L.  She was placed in Trendelenburg position and ports were placed in appropriate positions on her abdomen to allow maximum exposure during the robotic case. 2 additional robotic trocars were introduced into the abdomen under direct visualization,one in the left lower quadrant and 1 in the right lower quadrant. 839mairseal assist trocar was introduced into the abdomen in the right upper quadrant. The robot was docked and I proceeded with a robotic portion of the case.  The pelvis was inspected and the above findings were noted.  The fallopian tubes and ovaries were found to be normal. The remainder of her pelvis appeared normal with the exception of adhesions of bowel to the anterior abdominal wall. The anterior abdominal wall adhesion was taken down sharply and the omentum appeared hemostatic. The ureters and the infundibulopelvic ligaments were identified. Attention turned to the left fallopian tube and the mesosalpinx was serially cauterized and cut using bipolar marylands and monopolar scissors until the cornua reached. The utero-ovarian ligament was coagulated and divided. The ureter was visualized. The round ligament was coagulated and divided. The leaves of the broad ligament were divided.  The posterior leaf of the broad ligament was dissected to the cervical cuff.   The anterior leaf was dissected carefully, continuing  vessels were identified.  A bladder flap was developed, and the uterine vessels were skeletonized.  The uterine vessels were identified and cauterized. The bladder was pushed out of the operative site. Attention was turned to the left side where the same procedure was carried out -serial division of the meslosalpinx, the utero-ovarian ligament and the round ligament. The leaves of the broad ligament were separated and the uterine vessels skeletonized. The uterine vessels were coagulated cut and lateralized. Attention turned anteriorly to further dissect the adhesion and the adhesio was scored until the bladder flap was developed and the bladder moved out of the operative site. Attention returned to the left side and the uterine vessels were once again identified, coagulated, divided and lateralized. Once the  endopelvic fascia was adequately visualized, the colpotomy incision was extended circumferentially, following the blue outline of the Rumi manipulator. All pedicles were hemostatic.  The uterus was removed from the vagina with the fallopian tube segments. The vaginal cuff was closed with v-lock suture in a running fashion.  Excellent hemostasis was achieved throughout. The pelvis was irrigated. The intraabdominal pressure was lowered assess hemostasis. After determining excellent hemostasis, the robot was undocked. At this point I performed cystoscopy. The cystoscopy revealed ejection of urine from both ureters and intact bladder wall. The skin of all of the ports was closed with 4-0 vicryl. 30cc of 0.5% Marcaine was injected into the port sites.  The patient was then extubated and taken to recovery in stable condition.   Sponge, lap and needle counts were correct x 2.

## 2022-10-05 NOTE — Brief Op Note (Signed)
10/05/2022  3:35 PM  PATIENT:  Patricia Wise  42 y.o. female  PRE-OPERATIVE DIAGNOSIS:  AUB  POST-OPERATIVE DIAGNOSIS:  AUB  PROCEDURE:  Procedure(s): XI ROBOTIC ASSISTED TOTAL HYSTERECTOMY WITH SALPINGECTOMY (Bilateral) CYSTOSCOPY (N/A)  SURGEON:  Surgeon(s) and Role:    Darliss Cheney, MD - Primary    * Lavonia Drafts, MD - Assisting  PHYSICIAN ASSISTANT: n/a  ASSISTANTS: Harraway-Smith   ANESTHESIA:   local and general  EBL:  60 mL   BLOOD ADMINISTERED:none  DRAINS: none   LOCAL MEDICATIONS USED:  BUPIVICAINE   SPECIMEN:  Source of Specimen:  uterus, cervix, bilateral fallopian tubes  DISPOSITION OF SPECIMEN:  PATHOLOGY  COUNTS:  YES  TOURNIQUET:  * No tourniquets in log *  DICTATION: .Note written in EPIC  PLAN OF CARE: Discharge to home after PACU once discharge criteria met  PATIENT DISPOSITION:  PACU - hemodynamically stable.    Delay start of Pharmacological VTE agent (>24hrs) due to surgical blood loss or risk of bleeding: not applicable

## 2022-10-06 ENCOUNTER — Encounter (HOSPITAL_BASED_OUTPATIENT_CLINIC_OR_DEPARTMENT_OTHER): Payer: Self-pay | Admitting: Obstetrics and Gynecology

## 2022-10-06 DIAGNOSIS — N72 Inflammatory disease of cervix uteri: Secondary | ICD-10-CM | POA: Diagnosis not present

## 2022-10-06 MED ORDER — ACETAMINOPHEN 500 MG PO TABS
ORAL_TABLET | ORAL | Status: AC
  Filled 2022-10-06: qty 2

## 2022-10-06 MED ORDER — OXYCODONE HCL 5 MG PO TABS
ORAL_TABLET | ORAL | Status: AC
  Filled 2022-10-06: qty 2

## 2022-10-06 MED ORDER — GABAPENTIN 300 MG PO CAPS
ORAL_CAPSULE | ORAL | Status: AC
  Filled 2022-10-06: qty 1

## 2022-10-06 NOTE — Anesthesia Postprocedure Evaluation (Signed)
Anesthesia Post Note  Patient: Patricia Wise  Procedure(s) Performed: XI ROBOTIC ASSISTED TOTAL HYSTERECTOMY WITH SALPINGECTOMY (Bilateral: Pelvis) CYSTOSCOPY (Bladder)     Patient location during evaluation: PACU Anesthesia Type: General Level of consciousness: awake and alert Pain management: pain level controlled Vital Signs Assessment: post-procedure vital signs reviewed and stable Respiratory status: spontaneous breathing, nonlabored ventilation and respiratory function stable Cardiovascular status: stable and blood pressure returned to baseline Anesthetic complications: no   No notable events documented.  Last Vitals:  Vitals:   10/06/22 0651 10/06/22 0817  BP: 110/63 115/70  Pulse: 75 84  Resp: 16 18  Temp: 36.7 C 36.9 C  SpO2: 96% 98%    Last Pain:  Vitals:   10/06/22 0817  TempSrc:   PainSc: [place]

## 2022-10-06 NOTE — Progress Notes (Signed)
Gynecology Progress Note  Admission Date: 10/05/2022 Current Date: 10/06/2022 7:03 AM  Patricia Wise is a 42 y.o. [id number] HD#1 admitted for postop pain control.    History complicated by: Patient Active Problem List   Diagnosis Date Noted   Uterine bleeding 10/05/2022   Intractable abdominal pain 07/12/2019   Leukocytosis 07/12/2019   PUD (peptic ulcer disease) 11/21/2017   Marginal ulcer    Hypokalemia 08/16/2017   Continuous severe abdominal pain 08/16/2017   Dehydration 08/16/2017   Abdominal pain 08/16/2017   Jejunal ulcer    Missed abortion 02/15/2017   History of C-section 06/24/2015   Abnormal Pap smear of cervix 06/16/2015   Chlamydia 06/16/2015   Thyroid disease 06/16/2015   H/O gastric bypass 06/16/2015    ROS and patient/family/surgical history, located on admission H&P note dated 10/05/2022, have been reviewed, and there are no changes except as noted below Yesterday/Overnight Events:  Kept overnight for pain control. Given IV dilaudid x1 with good effect. Voided and ambulated.   Subjective:  Doing ok this morning. Pain moderately well controlled. Tolerating po. Able to ambulate. Voiding without issue. Minimal vaginal bleeding.   Objective:   Vitals:   10/05/22 1800 10/05/22 2139 10/06/22 0200 10/06/22 0651  BP: 122/81 115/73 108/66 110/63  Pulse: 70 68 77 75  Resp: '14 16 16 16  '$ Temp: 97.8 F (36.6 C) 98.1 F (36.7 C) 98.2 F (36.8 C) 98 F (36.7 C)  TempSrc: Oral     SpO2: 95% 97% 98% 96%  Weight:      Height:        Temp:  [97.5 F (36.4 C)-98.2 F (36.8 C)] 98 F (36.7 C) (01/03 0651) Pulse Rate:  [58-92] 75 (01/03 0651) Resp:  [11-17] 16 (01/03 0651) BP: (108-127)/(63-88) 110/63 (01/03 0651) SpO2:  [92 %-100 %] 96 % (01/03 0651) Weight:  [110.2 kg] 110.2 kg (01/02 1120) I/O last 3 completed shifts: In: 1200 [I.V.:1200] Out: 1010 [Urine:950; Blood:60] No intake/output data recorded.  Intake/Output Summary (Last 24 hours) at 10/06/2022  0703 Last data filed at 10/06/2022 0600 Gross per 24 hour  Intake 1200 ml  Output 1010 ml  Net 190 ml     Current Vital Signs 24h Vital Sign Ranges  T 98 F (36.7 C) Temp  Avg: 97.9 F (36.6 C)  Min: 97.5 F (36.4 C)  Max: 98.2 F (36.8 C)  BP 110/63 BP  Min: 108/66  Max: 127/87  HR 75 Pulse  Avg: 72.2  Min: 58  Max: 92  RR 16 Resp  Avg: 14.4  Min: 11  Max: 17  SaO2 96 % Room Air SpO2  Avg: 96.3 %  Min: 92 %  Max: 100 %       24 Hour I/O Current Shift I/O  Time Ins Outs 01/02 0701 - 01/03 0700 In: 1200 [I.V.:1200] Out: 1010 [Urine:950] No intake/output data recorded.   Patient Vitals for the past 12 hrs:  BP Temp Pulse Resp SpO2  10/06/22 0651 110/63 98 F (36.7 C) 75 16 96 %  10/06/22 0200 108/66 98.2 F (36.8 C) 77 16 98 %  10/05/22 2139 115/73 98.1 F (36.7 C) 68 16 97 %     Patient Vitals for the past 24 hrs:  BP Temp Temp src Pulse Resp SpO2 Height Weight  10/06/22 0651 110/63 98 F (36.7 C) -- 75 16 96 % -- --  10/06/22 0200 108/66 98.2 F (36.8 C) -- 77 16 98 % -- --  10/05/22 2139 115/73 98.1  F (36.7 C) -- 68 16 97 % -- --  10/05/22 1800 122/81 97.8 F (36.6 C) Oral 70 14 95 % -- --  10/05/22 1705 127/87 98.1 F (36.7 C) Oral 64 14 97 % -- --  10/05/22 1650 123/85 97.7 F (36.5 C) Oral 70 16 100 % -- --  10/05/22 1630 123/85 98.1 F (36.7 C) -- 65 11 92 % -- --  10/05/22 1615 123/87 -- -- 64 11 96 % -- --  10/05/22 1600 126/88 (!) 97.5 F (36.4 C) -- 72 15 98 % -- --  10/05/22 1545 118/87 -- -- 92 14 93 % -- --  10/05/22 1538 118/82 (!) 97.5 F (36.4 C) -- 91 13 94 % -- --  10/05/22 1120 114/84 97.8 F (36.6 C) Oral (!) 58 17 100 % '5\' 6"'$  (1.676 m) 110.2 kg    Physical exam: General appearance: alert, cooperative, and appears stated age Abdomen: soft, non-tender; bowel sounds normal; no masses,  no organomegaly GU: No gross VB Lungs: clear to auscultation bilaterally Heart: S1, S2 normal, no murmur, rub or gallop, regular rate and  rhythm Extremities: no lower extremity edema Incisions: clean dry and intact, dermabond in place  Psych: appropriate Neurologic: Grossly normal  Medications Current Facility-Administered Medications  Medication Dose Route Frequency Provider Last Rate Last Admin   acetaminophen (TYLENOL) tablet 1,000 mg  1,000 mg Oral Q6H Kemi Gell, MD   1,000 mg at 10/06/22 0206   gabapentin (NEURONTIN) capsule 300 mg  300 mg Oral TID Darliss Cheney, MD   300 mg at 10/06/22 0207   lactated ringers infusion   Intravenous Continuous Tera Pellicane, MD 100 mL/hr at 10/05/22 2044 New Bag at 10/05/22 2044   ondansetron (ZOFRAN) tablet 4 mg  4 mg Oral Q6H PRN Darliss Cheney, MD       Or   ondansetron (ZOFRAN) injection 4 mg  4 mg Intravenous Q6H PRN Sarthak Rubenstein, MD       oxyCODONE (Oxy IR/ROXICODONE) immediate release tablet 5-10 mg  5-10 mg Oral Q4H PRN Darliss Cheney, MD   10 mg at 10/06/22 0092   polyethylene glycol (MIRALAX / GLYCOLAX) packet 17 g  17 g Oral Daily PRN Darliss Cheney, MD          Labs  Recent Labs  Lab 09/30/22 0921  WBC 7.5  HGB 13.3  HCT 43.4  PLT 299    No results for input(s): "NA", "K", "CL", "CO2", "BUN", "CREATININE", "CALCIUM", "PROT", "BILITOT", "ALKPHOS", "ALT", "AST", "GLUCOSE" in the last 168 hours.  Invalid input(s): "LABALBU"  Radiology N/a  Assessment & Plan:  Overall doing well *GYN: s/p RA-TLH, BS, cysto; monitor vaginal bleeding and meeting postop goals *Pain: APAP/gabapentin/oxycodone *FEN/GI: regular diet, bowel regimen including prn miralax *PPx: SCDs and IS *Dispo: home this AM after breakfast  Code Status: Full Code   Darliss Cheney, MD [facility] 10/06/22 7:03 AM

## 2022-10-06 NOTE — Discharge Summary (Signed)
Physician Discharge Summary  Patient ID: Patricia Wise MRN: [id number] DOB/AGE: March 18, 1981 42 y.o.  Admit date: 10/05/2022 Discharge date: 10/06/2022  Admission Diagnoses: abnormal uterine bleeding  Discharge Diagnoses:  Active Problems:   Uterine bleeding   Discharged Condition: stable  Hospital Course: Patient presented for scheduled robotic-assisted laparoscopic hysterectomy, bilateral salpingectomy, and cystoscopy. Procedure was uncomplicated. Patient kept in extended recovery overnight for pain control.   Consults: None  Significant Diagnostic Studies: n/a  Treatments: IV hydration and analgesia: acetaminophen and Dilaudid  Discharge Exam: Blood pressure 110/63, pulse 75, temperature 98 F (36.7 C), resp. rate 16, height '5\' 6"'$  (1.676 m), weight 110.2 kg, last menstrual period 09/23/2022, SpO2 96 %. General appearance: alert and cooperative Resp: clear to auscultation bilaterally Cardio: regular rate and rhythm, S1, S2 normal, no murmur, click, rub or gallop GI: soft, appropriately tender Incision/Wound: laparoscopic incisions with dermabond clean/dry/intact  Disposition: home    Allergies as of 10/06/2022       Reactions   Latex Other (See Comments)   Patient states she cannot tolerate Latex in the genital region.        Medication List     TAKE these medications    acetaminophen 500 MG tablet Commonly known as: TYLENOL Take 2 tablets (1,000 mg total) by mouth every 6 (six) hours. What changed:  when to take this reasons to take this   ferrous sulfate 325 (65 FE) MG tablet Take 325 mg by mouth daily with breakfast.   gabapentin 300 MG capsule Commonly known as: NEURONTIN Take 1 capsule (300 mg total) by mouth 2 (two) times daily for 10 days.   oxyCODONE 5 MG immediate release tablet Commonly known as: Oxy IR/ROXICODONE Take 1 tablet (5 mg total) by mouth every 4 (four) hours as needed for severe pain or breakthrough pain.   polyethylene glycol 17  g packet Commonly known as: MIRALAX / GLYCOLAX Take 17 g by mouth daily.   simethicone 80 MG chewable tablet Commonly known as: Gas-X Chew 1 tablet (80 mg total) by mouth every 6 (six) hours as needed for flatulence.         Signed: Darliss Cheney 10/06/2022, 7:08 AM

## 2022-10-11 LAB — SURGICAL PATHOLOGY

## 2022-10-12 ENCOUNTER — Other Ambulatory Visit (INDEPENDENT_AMBULATORY_CARE_PROVIDER_SITE_OTHER): Payer: Self-pay | Admitting: Obstetrics and Gynecology

## 2022-10-14 ENCOUNTER — Telehealth: Payer: Self-pay | Admitting: Obstetrics and Gynecology

## 2022-10-14 DIAGNOSIS — G8918 Other acute postprocedural pain: Secondary | ICD-10-CM

## 2022-10-14 MED ORDER — GABAPENTIN 300 MG PO CAPS: 300.0000 mg | ORAL_CAPSULE | Freq: Three times a day (TID) | ORAL | 0 refills | Status: AC

## 2022-10-14 NOTE — Telephone Encounter (Signed)
Returned call and confirmed ID x2.   Still taking the tylenol, 2 extra strength at most twice a day after trying to wean herself  Pain is 4-5 and after moving around and mostly at night. Finished taking the gabapentin, not sure how much it helped. Having BM and tolerating PO. Has been ambulatory. Worried about post-medication BV but has not had any vaginal issues.

## 2022-11-16 ENCOUNTER — Other Ambulatory Visit (HOSPITAL_COMMUNITY)
Admission: RE | Admit: 2022-11-16 | Discharge: 2022-11-16 | Disposition: A | Discharge status: Home / Self Care | Payer: Medicaid Other | Source: Ambulatory Visit | Attending: Obstetrics and Gynecology | Admitting: Obstetrics and Gynecology

## 2022-11-16 ENCOUNTER — Ambulatory Visit (INDEPENDENT_AMBULATORY_CARE_PROVIDER_SITE_OTHER): Payer: Medicaid Other | Admitting: Obstetrics and Gynecology

## 2022-11-16 ENCOUNTER — Encounter: Payer: Self-pay | Admitting: Obstetrics and Gynecology

## 2022-11-16 ENCOUNTER — Other Ambulatory Visit: Payer: Self-pay

## 2022-11-16 VITALS — BP 113/77 | HR 99 | Wt 233.1 lb

## 2022-11-16 DIAGNOSIS — N760 Acute vaginitis: Secondary | ICD-10-CM | POA: Insufficient documentation

## 2022-11-16 DIAGNOSIS — B9689 Other specified bacterial agents as the cause of diseases classified elsewhere: Secondary | ICD-10-CM | POA: Diagnosis present

## 2022-11-16 NOTE — Progress Notes (Signed)
   POSTOPERATIVE VISIT NOTE   Subjective:     Patricia Wise is a 42 y.o. [id number] who presents to the clinic weeks status post  RA-TLH, BS, cysto  for abnormal uterine bleeding on 10/2022. Eating a regular diet without difficulty. Bowel movements are normal. The patient is not having any pain.  The following portions of the patient's history were reviewed and updated as appropriate: allergies, current medications, past family history, past medical history, past social history, past surgical history, and problem list.. Last pap on 08/2022 was negative, negative HPV.  Wondering if she can restart riding backs. Has been on pelvic rest.   Review of Systems Pertinent items are noted in HPI.    Objective:    BP 113/77   Pulse 99   Wt 233 lb 1.6 oz (105.7 kg)   BMI 37.62 kg/m  General:  alert, cooperative, and no distress  Abdomen: soft, bowel sounds active, non-tender  Incision:   healing well, no drainage, no erythema, no hernia, no seroma, no swelling, small bit of sutures , no dehiscence, incision well approximated  Pelvic:  Normal external genitalia, intact vaginal cuff, small volume discharge, no blood     Pathology Results: FINAL MICROSCOPIC DIAGNOSIS:   A. UTERUS AND CERVIX, WITH BILATERAL FALLOPIAN TUBES, HYSTERECTOMY:   - Benign squamous mucosa; negative for dysplasia   - Focal acute endocervicitis   - Benign proliferative endometrium; negative for hyperplasia   - Benign myometrium without diagnostic abnormality   - Benign, bilateral fallopian tubes without diagnostic abnormality   - Negative for malignancy    Assessment:   Doing well postoperatively. Operative findings again reviewed. Pathology report discussed.    Plan:   1. Continue any current medications. 2. Cleared to resume normal activty.  3. Activity restrictions: none 4. Anticipated return to work: now. 5. Vaginitis swab collected - if repeat BV, will use clinda cream  Darliss Cheney, MD [place], [facility] for [employer], [place]

## 2022-11-17 LAB — CERVICOVAGINAL ANCILLARY ONLY
Bacterial Vaginitis (gardnerella): NEGATIVE
Candida Glabrata: NEGATIVE
Candida Vaginitis: NEGATIVE
Comment: NEGATIVE
Comment: NEGATIVE
Comment: NEGATIVE

## 2024-06-08 ENCOUNTER — Other Ambulatory Visit: Payer: Self-pay | Admitting: Obstetrics and Gynecology

## 2024-06-08 DIAGNOSIS — Z1231 Encounter for screening mammogram for malignant neoplasm of breast: Secondary | ICD-10-CM

## 2024-06-21 ENCOUNTER — Ambulatory Visit

## 2024-06-28 ENCOUNTER — Ambulatory Visit
Admission: RE | Admit: 2024-06-28 | Discharge: 2024-06-28 | Disposition: A | Discharge status: Home / Self Care | Source: Ambulatory Visit | Attending: Obstetrics and Gynecology | Admitting: Obstetrics and Gynecology

## 2024-06-28 DIAGNOSIS — Z1231 Encounter for screening mammogram for malignant neoplasm of breast: Secondary | ICD-10-CM
